# Patient Record
Sex: Male | Born: 1960 | Race: White | Hispanic: No | Smoking: Never smoker
Health system: Southern US, Community
[De-identification: ages and names within clinical notes are randomized; demographics above are authoritative.]

## PROBLEM LIST (undated history)

## (undated) DIAGNOSIS — K219 Gastro-esophageal reflux disease without esophagitis: Secondary | ICD-10-CM

## (undated) DIAGNOSIS — M1990 Unspecified osteoarthritis, unspecified site: Secondary | ICD-10-CM

## (undated) DIAGNOSIS — T783XXA Angioneurotic edema, initial encounter: Secondary | ICD-10-CM

## (undated) HISTORY — DX: Angioneurotic edema, initial encounter: T78.3XXA

## (undated) HISTORY — PX: TONSILLECTOMY: SUR1361

---

## 2009-10-30 HISTORY — PX: MENISCUS REPAIR: SHX5179

## 2020-10-30 DIAGNOSIS — Z87442 Personal history of urinary calculi: Secondary | ICD-10-CM

## 2020-10-30 HISTORY — DX: Personal history of urinary calculi: Z87.442

## 2020-11-03 ENCOUNTER — Other Ambulatory Visit: Payer: Self-pay

## 2021-08-02 NOTE — Progress Notes (Signed)
New Patient Note  RE: Joshua Sellers MRN: [id number] DOB: 08-20-1961 Date of Office Visit: 08/03/2021  Consult requested by: No ref. provider found Primary care provider: Malka So., MD  Chief Complaint: Allergies (Allergic reaction to bee stings and maybe environmentals)  History of Present Illness: I had the pleasure of seeing Joshua Sellers for initial evaluation at the [facility] on 08/03/2021. He is a 60 y.o. male, who is self-referred here for the evaluation of bee sting allergy.  Bee stings: Patient had multiple stings with no issues but about 5 years ago he broke out in whole body hives. He had steroid injection with good benefit.  Recently patient got stung inside the nose by a wasp while outdoors trimming bushes. He had large localized swelling on the nose and lips. He also broke out in hives. He took some benadryl before going to bed and the following morning he had facial swelling. He started on oral prednisone at that time.  No prior work up.  Patient has Epipen on hand but no prior use. Patient does spend a lot of time outdoors and interested in venom immunotherapy.   Rhinitis: He reports symptoms of headaches, nasal congestion, PND, coughing. Symptoms have been going on for 5 years. The symptoms are present all year around. Anosmia: no. Headache: yes. He has used Careers adviser, Claritin, Singulair with fair improvement in symptoms. Sinus infections: no. Previous work up includes: none. Previous ENT evaluation: no, tonsillectomy as a child. Previous sinus imaging: no. History of nasal polyps: no. History of reflux: some symptoms.   07/01/2021 PCP visit: "This patient reports he was stung by a bee yesterday. It flew up and nostril, stung him, and he immediately began experiencing intranasal and facial swelling. He has been taking Benadryl with partial relief. He has a history of bee sting reactions, and has an EpiPen on hand. He did not need to use it  yesterday. He is otherwise felt well. He denies chest pain, shortness of breath, stridor. Symptoms are slowly improving."  Assessment and Plan: Joshua Sellers is a 60 y.o. male with: Bee sting reaction Episodes of breaking out in hives after being stung requiring systemic steroids but never epinephrine. No prior work up. Continue to avoid. For mild symptoms you can take over the counter antihistamines such as Benadryl and monitor symptoms closely. If symptoms worsen or if you have severe symptoms including breathing issues, throat closure, significant swelling, whole body hives, severe diarrhea and vomiting, lightheadedness then inject epinephrine and seek immediate medical care afterwards. Action plan given. Get bloodwork - if positive will start venom immunotherapy. If negative, will recommend skin testing next due to strong clinical history.   Other allergic rhinitis Perennial rhinitis symptoms for 5 years. Tried antihistamines and Singulair with some benefit. No prior work up. Today's skin testing showed: Positive to one mold only. Discussed with patient that above allergen does not explain his perennial symptoms.  Start environmental control measures as below. Use over the counter antihistamines such as Zyrtec (cetirizine), Claritin (loratadine), Allegra (fexofenadine), or Xyzal (levocetirizine) daily as needed. May switch antihistamines every few months. Continue Singulair (montelukast) 10mg  daily at night. Start dymista (fluticasone + azelastine nasal spray combination) 1 spray per nostril twice a day. If it's not covered let know.  Nasal saline spray (i.e., Simply Saline) or nasal saline lavage (i.e., NeilMed) is recommended as needed and prior to medicated nasal sprays.  Cough Coughing at times but no prior diagnosis of asthma or  inhaler use. Some reflux symptoms. Today's spirometry was normal. Monitor symptoms. Concerning for PND or heartburn contributing to his  coughing.  Heartburn See handout for lifestyle and dietary modifications.  Return in about 3 months (around 11/03/2021).  Meds ordered this encounter  Medications   Azelastine-Fluticasone 137-50 MCG/ACT SUSP    Sig: Place 1 spray into the nose in the morning and at bedtime.    Dispense:  23 g    Refill:  5    Lab Orders         Allergen Hymenoptera Panel         Tryptase      Other allergy screening: Asthma: no Food allergy: no Medication allergy: no Urticaria: no Eczema:no History of recurrent infections suggestive of immunodeficency: no  Diagnostics: Spirometry:  Tracings reviewed. His effort: Good reproducible efforts. FVC: 4.58L FEV1: 3.55L, 92% predicted FEV1/FVC ratio: 78% Interpretation: Spirometry consistent with normal pattern.  Please see scanned spirometry results for details.  Skin Testing: Environmental allergy panel. Positive to one mold only. Results discussed with patient/family.  Airborne Adult Perc - 08/03/21 1438     Time Antigen Placed 1439    Allergen Manufacturer Waynette Buttery    Location Back    Number of Test 59    Panel 1 Select    1. Control-Buffer 50% Glycerol Negative    2. Control-Histamine 1 mg/ml 2+    3. Albumin saline Negative    4. Bahia Negative    5. French Southern Territories Negative    6. Johnson Negative    7. Kentucky Blue Negative    8. Meadow Fescue Negative    9. Perennial Rye Negative    10. Sweet Vernal Negative    11. Timothy Negative    12. Cocklebur Negative    13. Burweed Marshelder Negative    14. Ragweed, short Negative    15. Ragweed, Giant Negative    16. Plantain,  English Negative    17. Lamb's Quarters Negative    18. Sheep Sorrell Negative    19. Rough Pigweed Negative    20. Marsh Elder, Rough Negative    21. Mugwort, Common Negative    22. Ash mix Negative    23. Birch mix Negative    24. Beech American Negative    25. Box, Elder Negative    26. Cedar, red Negative    27. Cottonwood, Guinea-Bissau Negative    28. Elm  mix Negative    29. Hickory Negative    30. Maple mix Negative    31. Oak, Guinea-Bissau mix Negative    32. Pecan Pollen Negative    33. Pine mix Negative    34. Sycamore Eastern Negative    35. Walnut, Black Pollen Negative    36. Alternaria alternata Negative    37. Cladosporium Herbarum Negative    38. Aspergillus mix Negative    39. Penicillium mix Negative    40. Bipolaris sorokiniana (Helminthosporium) Negative    41. Drechslera spicifera (Curvularia) Negative    42. Mucor plumbeus Negative    43. Fusarium moniliforme Negative    44. Aureobasidium pullulans (pullulara) Negative    45. Rhizopus oryzae Negative    46. Botrytis cinera Negative    47. Epicoccum nigrum Negative    48. Phoma betae Negative    49. Candida Albicans Negative    50. Trichophyton mentagrophytes 2+    51. Mite, D Farinae  5,000 AU/ml Negative    52. Mite, D Pteronyssinus  5,000 AU/ml Negative    53.  Cat Hair 10,000 BAU/ml Negative    54.  Dog Epithelia Negative    55. Mixed Feathers Negative    56. Horse Epithelia Negative    57. Cockroach, German Negative    58. Mouse Negative    59. Tobacco Leaf Negative             Intradermal - 08/03/21 1538     Time Antigen Placed 1610    Allergen Manufacturer Waynette Buttery    Location Arm    Number of Test 15    Intradermal Select    Control Negative    French Southern Territories Negative    Johnson Negative    7 Grass Negative    Ragweed mix Negative    Weed mix Negative    Tree mix Negative    Mold 1 Negative    Mold 2 Negative    Mold 3 Negative    Mold 4 Negative    Cat Negative    Dog Negative    Cockroach Negative    Mite mix Negative             Past Medical History: Patient Active Problem List   Diagnosis Date Noted   Bee sting reaction 08/03/2021   Cough 08/03/2021   Other allergic rhinitis 08/03/2021   Heartburn 08/03/2021    Past Medical History:  Diagnosis Date   Angio-edema    Past Surgical History: Past Surgical History:  Procedure  Laterality Date   TONSILLECTOMY     Medication List:  Current Outpatient Medications  Medication Sig Dispense Refill   Azelastine-Fluticasone 137-50 MCG/ACT SUSP Place 1 spray into the nose in the morning and at bedtime. 23 g 5   chlorhexidine (PERIDEX) 0.12 % solution 15 mLs 2 (two) times daily.     diphenhydrAMINE (SOMINEX) 25 MG tablet Take by mouth.     EPINEPHrine 0.3 mg/0.3 mL IJ SOAJ injection Inject into the muscle.     fexofenadine (ALLEGRA) 180 MG tablet Take 180 mg by mouth daily.     ketoconazole (NIZORAL) 2 % shampoo Apply topically.     montelukast (SINGULAIR) 10 MG tablet Take by mouth.     Tadalafil 2.5 MG TABS Cialis 2.5 mg tablet     No current facility-administered medications for this visit.   Allergies: Allergies  Allergen Reactions   Bee Venom Hives, Itching, Other (See Comments), Rash and Swelling    Swelling  Swelling     Poison Oak Extract Hives and Rash   Pollen Extract Other (See Comments)   Social History: Social History   Socioeconomic History   Marital status: Not on file    Spouse name: Not on file   Number of children: Not on file   Years of education: Not on file   Highest education level: Not on file  Occupational History   Not on file  Tobacco Use   Smoking status: Never    Passive exposure: Never   Smokeless tobacco: Never  Vaping Use   Vaping Use: Never used  Substance and Sexual Activity   Alcohol use: Yes    Alcohol/week: 3.0 standard drinks    Types: 3 Standard drinks or equivalent per week   Drug use: Never   Sexual activity: Not on file  Other Topics Concern   Not on file  Social History Narrative   Not on file   Social Determinants of Health   Financial Resource Strain: Not on file  Food Insecurity: Not on file  Transportation Needs: Not on file  Physical Activity: Not on file  Stress: Not on file  Social Connections: Not on file   Lives in a 61 year old house. Smoking: denies Occupation: Art gallery manager HistorySurveyor, minerals in the house: no Engineer, civil (consulting) in the family room: no Carpet in the bedroom: no Heating: gas Cooling: central Pet: yes 4 cat x 4 yrs  Family History: Family History  Problem Relation Age of Onset   Allergic rhinitis Father    Asthma Neg Hx    Eczema Neg Hx    Urticaria Neg Hx    Review of Systems  Constitutional:  Negative for appetite change, chills, fever and unexpected weight change.  HENT:  Positive for congestion and postnasal drip. Negative for rhinorrhea.   Eyes:  Negative for itching.  Respiratory:  Positive for cough. Negative for chest tightness, shortness of breath and wheezing.   Cardiovascular:  Negative for chest pain.  Gastrointestinal:  Negative for abdominal pain.  Genitourinary:  Negative for difficulty urinating.  Skin:  Negative for rash.  Allergic/Immunologic: Positive for environmental allergies.  Neurological:  Positive for headaches.   Objective: BP (!) 124/52 (BP Location: Right Arm, Patient Position: Sitting, Cuff Size: Normal)   Pulse 63   Temp 98.2 F (36.8 C) (Temporal)   Resp 18   Ht 6\' 1"  (1.854 m)   Wt 162 lb 6.4 oz (73.7 kg)   SpO2 97%   BMI 21.43 kg/m  Body mass index is 21.43 kg/m. Physical Exam Vitals and nursing note reviewed.  Constitutional:      Appearance: Normal appearance. He is well-developed.  HENT:     Head: Normocephalic and atraumatic.     Right Ear: Tympanic membrane and external ear normal.     Left Ear: Tympanic membrane and external ear normal.     Nose: Nose normal.     Mouth/Throat:     Mouth: Mucous membranes are moist.     Pharynx: Oropharynx is clear.  Eyes:     Conjunctiva/sclera: Conjunctivae normal.  Cardiovascular:     Rate and Rhythm: Normal rate and regular rhythm.     Heart sounds: Normal heart sounds. No murmur heard.   No friction rub. No gallop.  Pulmonary:     Effort: Pulmonary effort is normal.     Breath sounds: Normal breath sounds. No  wheezing, rhonchi or rales.  Musculoskeletal:     Cervical back: Neck supple.  Skin:    General: Skin is warm.     Findings: No rash.  Neurological:     Mental Status: He is alert and oriented to person, place, and time.  Psychiatric:        Behavior: Behavior normal.  The plan was reviewed with the patient/family, and all questions/concerned were addressed.  It was my pleasure to see Zhamir today and participate in his care. Please feel free to contact me with any questions or concerns.  Sincerely,  Onalee Hua, DO Allergy & Immunology  [facility] office: [phone] [facility] office: [phone]

## 2021-08-03 ENCOUNTER — Encounter: Payer: Self-pay | Admitting: Allergy

## 2021-08-03 ENCOUNTER — Other Ambulatory Visit: Payer: Self-pay

## 2021-08-03 ENCOUNTER — Ambulatory Visit (INDEPENDENT_AMBULATORY_CARE_PROVIDER_SITE_OTHER): Payer: BC Managed Care – PPO | Admitting: Allergy

## 2021-08-03 VITALS — BP 124/52 | HR 63 | Temp 98.2°F | Resp 18 | Ht 73.0 in | Wt 162.4 lb

## 2021-08-03 DIAGNOSIS — T63441D Toxic effect of venom of bees, accidental (unintentional), subsequent encounter: Secondary | ICD-10-CM

## 2021-08-03 DIAGNOSIS — T63441A Toxic effect of venom of bees, accidental (unintentional), initial encounter: Secondary | ICD-10-CM | POA: Insufficient documentation

## 2021-08-03 DIAGNOSIS — R12 Heartburn: Secondary | ICD-10-CM | POA: Insufficient documentation

## 2021-08-03 DIAGNOSIS — R058 Other specified cough: Secondary | ICD-10-CM

## 2021-08-03 DIAGNOSIS — R059 Cough, unspecified: Secondary | ICD-10-CM | POA: Insufficient documentation

## 2021-08-03 DIAGNOSIS — J3089 Other allergic rhinitis: Secondary | ICD-10-CM | POA: Diagnosis not present

## 2021-08-03 MED ORDER — AZELASTINE-FLUTICASONE 137-50 MCG/ACT NA SUSP: 1.0000 | Freq: Two times a day (BID) | NASAL | 5 refills | Status: DC

## 2021-08-03 NOTE — Assessment & Plan Note (Signed)
Coughing at times but no prior diagnosis of asthma or inhaler use. Some reflux symptoms.  Today's spirometry was normal. . Monitor symptoms. . Concerning for PND or heartburn contributing to his coughing.

## 2021-08-03 NOTE — Assessment & Plan Note (Signed)
See handout for lifestyle and dietary modifications. 

## 2021-08-03 NOTE — Assessment & Plan Note (Signed)
Perennial rhinitis symptoms for 5 years. Tried antihistamines and Singulair with some benefit. No prior work up.  Today's skin testing showed: Positive to one mold only.  Discussed with patient that above allergen does not explain his perennial symptoms.   Start environmental control measures as below.  Use over the counter antihistamines such as Zyrtec (cetirizine), Claritin (loratadine), Allegra (fexofenadine), or Xyzal (levocetirizine) daily as needed. May switch antihistamines every few months.  Continue Singulair (montelukast) 10mg  daily at night.  Start dymista (fluticasone + azelastine nasal spray combination) 1 spray per nostril twice a day. If it's not covered let know.   Nasal saline spray (i.e., Simply Saline) or nasal saline lavage (i.e., NeilMed) is recommended as needed and prior to medicated nasal sprays.

## 2021-08-03 NOTE — Assessment & Plan Note (Signed)
Episodes of breaking out in hives after being stung requiring systemic steroids but never epinephrine. No prior work up. . Continue to avoid. . For mild symptoms you can take over the counter antihistamines such as Benadryl and monitor symptoms closely. If symptoms worsen or if you have severe symptoms including breathing issues, throat closure, significant swelling, whole body hives, severe diarrhea and vomiting, lightheadedness then inject epinephrine and seek immediate medical care afterwards. . Action plan given. . Get bloodwork - if positive will start venom immunotherapy. o If negative, will recommend skin testing next due to strong clinical history.

## 2021-08-03 NOTE — Patient Instructions (Addendum)
Today's skin testing showed: Positive to one mold only. Results given.  Bee sting:  Continue to avoid. For mild symptoms you can take over the counter antihistamines such as Benadryl and monitor symptoms closely. If symptoms worsen or if you have severe symptoms including breathing issues, throat closure, significant swelling, whole body hives, severe diarrhea and vomiting, lightheadedness then inject epinephrine and seek immediate medical care afterwards. Action plan given. Get bloodwork - if positive will start allergy injections. If negative, will recommend skin testing due to strong clinical history.  We are ordering labs, so please allow 1-2 weeks for the results to come back. With the newly implemented Cures Act, the labs might be visible to you at the same time that they become visible to me. However, I will not address the results until all of the results are back, so please be patient.  In the meantime, continue recommendations in your patient instructions, including avoidance measures (if applicable), until you hear from me.  Environmental allergies Start environmental control measures as below. Use over the counter antihistamines such as Zyrtec (cetirizine), Claritin (loratadine), Allegra (fexofenadine), or Xyzal (levocetirizine) daily as needed. May switch antihistamines every few months. Continue Singulair (montelukast) 10mg  daily at night.  Start dymista (fluticasone + azelastine nasal spray combination) 1 spray per nostril twice a day. If it's not covered let know.  Nasal saline spray (i.e., Simply Saline) or nasal saline lavage (i.e., NeilMed) is recommended as needed and prior to medicated nasal sprays.  Coughing: Normal breathing test today. Monitor symptoms.  Heartburn: See handout for lifestyle and dietary modifications.  Follow up in 3 months or sooner if needed.    Mold Control Mold and fungi can grow on a variety of surfaces provided certain temperature and  moisture conditions exist.  Outdoor molds grow on plants, decaying vegetation and soil. The major outdoor mold, Alternaria and Cladosporium, are found in very high numbers during hot and dry conditions. Generally, a late summer - fall peak is seen for common outdoor fungal spores. Rain will temporarily lower outdoor mold spore count, but counts rise rapidly when the rainy period ends. The most important indoor molds are Aspergillus and Penicillium. Dark, humid and poorly ventilated basements are ideal sites for mold growth. The next most common sites of mold growth are the bathroom and the kitchen. Outdoor (Seasonal) Mold Control Use air conditioning and keep windows closed. Avoid exposure to decaying vegetation. Avoid leaf raking. Avoid grain handling. Consider wearing a face mask if working in moldy areas.  Indoor (Perennial) Mold Control  Maintain humidity below 50%. Get rid of mold growth on hard surfaces with water, detergent and, if necessary, 5% bleach (do not mix with other cleaners). Then dry the area completely. If mold covers an area more than 10 square feet, consider hiring an indoor environmental professional. For clothing, washing with soap and water is best. If moldy items cannot be cleaned and dried, throw them away. Remove sources e.g. contaminated carpets. Repair and seal leaking roofs or pipes. Using dehumidifiers in damp basements may be helpful, but empty the water and clean units regularly to prevent mildew from forming. All rooms, especially basements, bathrooms and kitchens, require ventilation and cleaning to deter mold and mildew growth. Avoid carpeting on concrete or damp floors, and storing items in damp areas.

## 2021-08-24 ENCOUNTER — Telehealth: Payer: Self-pay | Admitting: Allergy

## 2021-08-24 NOTE — Telephone Encounter (Signed)
Informed pt of going to [facility] and no appt needed he stated understanding

## 2021-08-24 NOTE — Telephone Encounter (Signed)
Please call patient.  He was given lab order for tryptase and hymenoptera panel.   Get bloodwork - if positive will start venom immunotherapy. If negative, will recommend skin testing next due to strong clinical history.

## 2021-08-24 NOTE — Telephone Encounter (Signed)
Please advise for directions for blood work.

## 2021-08-24 NOTE — Telephone Encounter (Signed)
Patient called to talk with Dr. Selena Batten about having the blood work for venoms done. She told him to go to [facility] and they want him to see one of there doctor. So he is confused and may not have the correct information, can someone call him [phone]

## 2021-09-07 LAB — ALLERGEN HYMENOPTERA PANEL
Bumblebee: <0.1 kU/L
Honeybee IgE: <0.1 kU/L
Hornet, White Face, IgE: 0.89 kU/L — AB
Hornet, Yellow, IgE: 0.5 kU/L — AB
Paper Wasp IgE: 4.19 kU/L — AB
Yellow Jacket, IgE: 2.09 kU/L — AB

## 2021-09-07 LAB — TRYPTASE: Tryptase: 3.6 ug/L (ref 2.2–13.2)

## 2021-09-08 ENCOUNTER — Telehealth: Payer: Self-pay

## 2021-09-08 DIAGNOSIS — T782XXD Anaphylactic shock, unspecified, subsequent encounter: Secondary | ICD-10-CM

## 2021-09-08 NOTE — Addendum Note (Signed)
Addended by: Ellamae Sia on: 09/08/2021 12:53 PM   Modules accepted: Orders

## 2021-09-08 NOTE — Telephone Encounter (Signed)
Patient call back returning our phone call about his lab results. Patient was informed of his lab results and verbalized understanding and scheduled an appointment to start venom injections.

## 2021-09-08 NOTE — Telephone Encounter (Signed)
Noted. Orders placed.

## 2021-10-11 ENCOUNTER — Ambulatory Visit (INDEPENDENT_AMBULATORY_CARE_PROVIDER_SITE_OTHER): Payer: BC Managed Care – PPO

## 2021-10-11 ENCOUNTER — Other Ambulatory Visit: Payer: Self-pay

## 2021-10-11 DIAGNOSIS — T63441D Toxic effect of venom of bees, accidental (unintentional), subsequent encounter: Secondary | ICD-10-CM

## 2021-10-11 NOTE — Progress Notes (Addendum)
Immunotherapy   Patient Details  Name: Joshua Sellers MRN: [id number] Date of Birth: 1960/12/31  10/11/2021  [name] started Venom (MV-0.003 and Wasp-0.001 mg/mL Frequency: 1 time per week, @ 0.05 given Epi-Pen:Epi-Pen Available  Consent signed and patient instructions given.   [name] J [name] 10/11/2021, 8:58 AM

## 2021-10-19 ENCOUNTER — Ambulatory Visit (INDEPENDENT_AMBULATORY_CARE_PROVIDER_SITE_OTHER): Payer: BC Managed Care – PPO | Admitting: *Deleted

## 2021-10-19 DIAGNOSIS — T63441D Toxic effect of venom of bees, accidental (unintentional), subsequent encounter: Secondary | ICD-10-CM | POA: Diagnosis not present

## 2021-10-26 ENCOUNTER — Other Ambulatory Visit: Payer: Self-pay

## 2021-10-26 ENCOUNTER — Ambulatory Visit (INDEPENDENT_AMBULATORY_CARE_PROVIDER_SITE_OTHER): Payer: BC Managed Care – PPO

## 2021-10-26 DIAGNOSIS — T63441D Toxic effect of venom of bees, accidental (unintentional), subsequent encounter: Secondary | ICD-10-CM | POA: Diagnosis not present

## 2021-11-02 ENCOUNTER — Other Ambulatory Visit: Payer: Self-pay

## 2021-11-02 ENCOUNTER — Ambulatory Visit (INDEPENDENT_AMBULATORY_CARE_PROVIDER_SITE_OTHER): Payer: BC Managed Care – PPO

## 2021-11-02 DIAGNOSIS — T63441D Toxic effect of venom of bees, accidental (unintentional), subsequent encounter: Secondary | ICD-10-CM | POA: Diagnosis not present

## 2021-11-09 ENCOUNTER — Other Ambulatory Visit: Payer: Self-pay

## 2021-11-09 ENCOUNTER — Ambulatory Visit (INDEPENDENT_AMBULATORY_CARE_PROVIDER_SITE_OTHER): Payer: BC Managed Care – PPO

## 2021-11-09 DIAGNOSIS — T63441D Toxic effect of venom of bees, accidental (unintentional), subsequent encounter: Secondary | ICD-10-CM

## 2021-11-16 ENCOUNTER — Ambulatory Visit (INDEPENDENT_AMBULATORY_CARE_PROVIDER_SITE_OTHER): Payer: BC Managed Care – PPO

## 2021-11-16 ENCOUNTER — Other Ambulatory Visit: Payer: Self-pay

## 2021-11-16 DIAGNOSIS — T63441D Toxic effect of venom of bees, accidental (unintentional), subsequent encounter: Secondary | ICD-10-CM | POA: Diagnosis not present

## 2021-11-23 ENCOUNTER — Ambulatory Visit (INDEPENDENT_AMBULATORY_CARE_PROVIDER_SITE_OTHER): Payer: BC Managed Care – PPO

## 2021-11-23 ENCOUNTER — Other Ambulatory Visit: Payer: Self-pay

## 2021-11-23 DIAGNOSIS — T63441D Toxic effect of venom of bees, accidental (unintentional), subsequent encounter: Secondary | ICD-10-CM | POA: Diagnosis not present

## 2021-11-30 ENCOUNTER — Other Ambulatory Visit: Payer: Self-pay

## 2021-11-30 ENCOUNTER — Ambulatory Visit (INDEPENDENT_AMBULATORY_CARE_PROVIDER_SITE_OTHER): Payer: BC Managed Care – PPO

## 2021-11-30 DIAGNOSIS — T63441D Toxic effect of venom of bees, accidental (unintentional), subsequent encounter: Secondary | ICD-10-CM | POA: Diagnosis not present

## 2021-12-07 ENCOUNTER — Ambulatory Visit (INDEPENDENT_AMBULATORY_CARE_PROVIDER_SITE_OTHER): Payer: BC Managed Care – PPO

## 2021-12-07 ENCOUNTER — Other Ambulatory Visit: Payer: Self-pay

## 2021-12-07 DIAGNOSIS — T63441D Toxic effect of venom of bees, accidental (unintentional), subsequent encounter: Secondary | ICD-10-CM

## 2021-12-14 ENCOUNTER — Telehealth: Payer: Self-pay

## 2021-12-14 ENCOUNTER — Ambulatory Visit (INDEPENDENT_AMBULATORY_CARE_PROVIDER_SITE_OTHER): Payer: BC Managed Care – PPO

## 2021-12-14 ENCOUNTER — Other Ambulatory Visit: Payer: Self-pay

## 2021-12-14 DIAGNOSIS — T63441D Toxic effect of venom of bees, accidental (unintentional), subsequent encounter: Secondary | ICD-10-CM

## 2021-12-14 NOTE — Telephone Encounter (Signed)
Patient came in today and stated that he has been experiencing facial tingling 2 weeks after starting venom injections. He is having some nerve issues in neck and spine but his neurologist doesn't think the head would be involved. Spoke with Dr. Delorse Lek to see if he should get his venom today and she said yes. She also doesn't think the tingling in his face has anything to do with the shots but wanted to sent message to you. Please advise.

## 2021-12-14 NOTE — Telephone Encounter (Signed)
Unlikely to be related to VIT. But have patient schedule a follow up to discuss further.  Thank you.

## 2021-12-21 ENCOUNTER — Ambulatory Visit (INDEPENDENT_AMBULATORY_CARE_PROVIDER_SITE_OTHER): Payer: BC Managed Care – PPO

## 2021-12-21 ENCOUNTER — Other Ambulatory Visit: Payer: Self-pay

## 2021-12-21 DIAGNOSIS — T63441D Toxic effect of venom of bees, accidental (unintentional), subsequent encounter: Secondary | ICD-10-CM | POA: Diagnosis not present

## 2021-12-21 NOTE — Telephone Encounter (Signed)
Patient came in today for venom injections. Dr. Elmyra Ricks message given to patient. Patient made appointment for follow up visit.

## 2021-12-28 ENCOUNTER — Ambulatory Visit (INDEPENDENT_AMBULATORY_CARE_PROVIDER_SITE_OTHER): Payer: BC Managed Care – PPO

## 2021-12-28 ENCOUNTER — Other Ambulatory Visit: Payer: Self-pay | Admitting: Neurosurgery

## 2021-12-28 ENCOUNTER — Other Ambulatory Visit: Payer: Self-pay

## 2021-12-28 DIAGNOSIS — T63441D Toxic effect of venom of bees, accidental (unintentional), subsequent encounter: Secondary | ICD-10-CM

## 2022-01-03 ENCOUNTER — Ambulatory Visit (INDEPENDENT_AMBULATORY_CARE_PROVIDER_SITE_OTHER): Payer: BC Managed Care – PPO

## 2022-01-03 ENCOUNTER — Other Ambulatory Visit: Payer: Self-pay

## 2022-01-03 DIAGNOSIS — T63441D Toxic effect of venom of bees, accidental (unintentional), subsequent encounter: Secondary | ICD-10-CM | POA: Diagnosis not present

## 2022-01-11 ENCOUNTER — Ambulatory Visit (INDEPENDENT_AMBULATORY_CARE_PROVIDER_SITE_OTHER): Payer: BC Managed Care – PPO

## 2022-01-11 ENCOUNTER — Other Ambulatory Visit: Payer: Self-pay

## 2022-01-11 DIAGNOSIS — T63441D Toxic effect of venom of bees, accidental (unintentional), subsequent encounter: Secondary | ICD-10-CM | POA: Diagnosis not present

## 2022-01-17 DIAGNOSIS — T782XXA Anaphylactic shock, unspecified, initial encounter: Secondary | ICD-10-CM | POA: Insufficient documentation

## 2022-01-17 NOTE — Progress Notes (Signed)
? ?Follow Up Note ? ?RE: Joshua Sellers MRN: [id number] DOB: 1961-06-13 ?Date of Office Visit: 01/18/2022 ? ?Referring provider: Malka So., MD ?Primary care provider: Malka So., MD ? ?Chief Complaint: Other (Facial, arm, leg tingling) ? ?History of Present Illness: ?I had the pleasure of seeing Joshua Sellers for a follow up visit at the [facility] on 01/18/2022. He is a 61 y.o. 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 His previous allergy office visit was on 08/03/2021 with Dr. Selena Batten. Today is a regular follow up visit. ? ?Bee sting allergy ?Started VIT on 10/11/2021. ?He noted some numbness on left arm on 10/04/21 this started before starting the injections. ?While waiting after his first VIT he noted some left arm and left leg numbness as well.  ? ?He then noted left sided facial numbness the following week. ? ?Went to see cardiologist and work up was negative. ?Then he went to see neurologist and had MRI of brain/neck. 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 Scheduled for surgery in June. Per neurology though this does not explain his one sided facial numbness.  ? ?Symptoms do not worsen on the days of injections. ? ?Getting VIT weekly with no issues. ? ?No stings since the last visit. ?Still has Epipen. ?  ?Allergic rhinitis ?Taking Singulair and allegra daily with some benefit. ?Still has some nasal congestion at times. Uses nasal spray on a rare basis.  ? ?Cough ?Resolved. ?  ?Heartburn ?Sometimes. Noted that margaritas worsen symptoms.  ? ?Assessment and Plan: ?Joshua Sellers is a 61 y.o. male with: ?Anaphylaxis due to hymenoptera venom ?Past history - Episodes of breaking out in hives after being stung requiring systemic steroids but never epinephrine.  ?Interim history - 2022 bloodwork positive to white face hornet, yellow jacket, wasp and yellow hornet. Started VIT on 10/11/2021 (MV & Wasp). ?Patient concerned if the VIT or allergies  could be contributing to his left sided facial numbness. Discussed that unlikely that his symptoms are related to the VIT as they do not worsen the day of injections and one sided symptoms are not typical of allergies. ?Already being followed by neurology.  ?Continue to avoid. ?Continue injections - given today.  ?For mild symptoms you can take over the counter antihistamines such as Benadryl and monitor symptoms closely. If symptoms worsen or if you have severe symptoms including breathing issues, throat closure, significant swelling, whole body hives, severe diarrhea and vomiting, lightheadedness then inject epinephrine and seek immediate medical care afterwards. ?Action plan in place.  ? ?Other allergic rhinitis ?Past history - Perennial rhinitis symptoms for 5 years. 2022 skin testing showed: Positive to one mold only. ?Interim history - controlled with Singulair and allegra. ?Continue environmental control measures as below. ?Use over the counter antihistamines such as Zyrtec (cetirizine), Claritin (loratadine), Allegra (fexofenadine), or Xyzal (levocetirizine) daily as needed. May switch antihistamines every few months. ?Continue Singulair (montelukast) 10mg  daily at night. ?May use dymista (fluticasone + azelastine nasal spray combination) 1 spray per nostril twice a day if needed. ?Nasal saline spray (i.e., Simply Saline) or nasal saline lavage (i.e., NeilMed) is recommended as needed and prior to medicated nasal sprays. ? ?Heartburn ?Continue lifestyle and dietary modifications. ? ?Return in about 6 months (around 07/21/2022). ? ?No orders of the defined types were placed in this encounter. ? ?Lab Orders  ?No laboratory test(s) ordered today  ? ? ?Diagnostics: ?None.  ? ? ?Medication List:  ?  Current Outpatient Medications  ?Medication Sig Dispense Refill  ? Azelastine-Fluticasone 137-50 MCG/ACT SUSP Place 1 spray into the nose in the morning and at bedtime. 23 g 5  ? chlorhexidine (PERIDEX) 0.12 % solution  15 mLs 2 (two) times daily.    ? EPINEPHrine 0.3 mg/0.3 mL IJ SOAJ injection Inject into the muscle.    ? fexofenadine (ALLEGRA) 180 MG tablet Take 180 mg by mouth daily.    ? ketoconazole (NIZORAL) 2 % shampoo Apply topically.    ? montelukast (SINGULAIR) 10 MG tablet Take by mouth.    ? Tadalafil 2.5 MG TABS Cialis 2.5 mg tablet    ? ?No current facility-administered medications for this visit.  ? ?Allergies: ?Allergies  ?Allergen Reactions  ? Bee Venom Hives, Itching, Other (See Comments), Rash and Swelling  ?  Swelling  ?Swelling  ?  ? Poison Oak Extract Hives and Rash  ? Pollen Extract Other (See Comments)  ? ?I reviewed his past medical history, social history, family history, and environmental history and no significant changes have been reported from his previous visit. ? ?Review of Systems  ?Constitutional:  Negative for appetite change, chills, fever and unexpected weight change.  ?HENT:  Positive for congestion. Negative for postnasal drip and rhinorrhea.   ?Eyes:  Negative for itching.  ?Respiratory:  Negative for cough, chest tightness, shortness of breath and wheezing.   ?Cardiovascular:  Negative for chest pain.  ?Gastrointestinal:  Negative for abdominal pain.  ?Genitourinary:  Negative for difficulty urinating.  ?Skin:  Negative for rash.  ?Allergic/Immunologic: Positive for environmental allergies.  ? ?Objective: ?BP 132/78   Pulse (!) 54   Temp 98 ?F (36.7 ?C) (Temporal)   Resp 16   SpO2 98%  ?There is no height or weight on file to calculate BMI. ?Physical Exam ?Vitals and nursing note reviewed.  ?Constitutional:   ?   Appearance: Normal appearance. He is well-developed.  ?HENT:  ?   Head: Normocephalic and atraumatic.  ?   Right Ear: Tympanic membrane and external ear normal.  ?   Left Ear: Tympanic membrane and external ear normal.  ?   Nose: Nose normal.  ?   Mouth/Throat:  ?   Mouth: Mucous membranes are moist.  ?   Pharynx: Oropharynx is clear.  ?Eyes:  ?   Conjunctiva/sclera:  Conjunctivae normal.  ?Cardiovascular:  ?   Rate and Rhythm: Normal rate and regular rhythm.  ?   Heart sounds: Normal heart sounds. No murmur heard. ?  No friction rub. No gallop.  ?Pulmonary:  ?   Effort: Pulmonary effort is normal.  ?   Breath sounds: Normal breath sounds. No wheezing, rhonchi or rales.  ?Musculoskeletal:  ?   Cervical back: Neck supple.  ?Skin: ?   General: Skin is warm.  ?   Findings: No rash.  ?Neurological:  ?   Mental Status: He is alert and oriented to person, place, and time.  ?Psychiatric:     ?   Behavior: Behavior normal.  ? ?Previous notes and tests were reviewed. ?The plan was reviewed with the patient/family, and all questions/concerned were addressed. ? ?It was my pleasure to see Joshua today and participate in his care. Please feel free to contact me with any questions or concerns. ? ?Sincerely, ? ?Wyline Mood, DO ?Allergy & Immunology ? ?[facility] ?[place] office: [phone] ?[place] office: [phone] ?

## 2022-01-18 ENCOUNTER — Ambulatory Visit: Payer: BC Managed Care – PPO

## 2022-01-18 ENCOUNTER — Other Ambulatory Visit: Payer: Self-pay

## 2022-01-18 ENCOUNTER — Ambulatory Visit: Payer: BC Managed Care – PPO | Admitting: Allergy

## 2022-01-18 ENCOUNTER — Ambulatory Visit (INDEPENDENT_AMBULATORY_CARE_PROVIDER_SITE_OTHER): Payer: BC Managed Care – PPO

## 2022-01-18 ENCOUNTER — Encounter: Payer: Self-pay | Admitting: Allergy

## 2022-01-18 VITALS — BP 132/78 | HR 54 | Temp 98.0°F | Resp 16

## 2022-01-18 DIAGNOSIS — J3089 Other allergic rhinitis: Secondary | ICD-10-CM

## 2022-01-18 DIAGNOSIS — T782XXD Anaphylactic shock, unspecified, subsequent encounter: Secondary | ICD-10-CM

## 2022-01-18 DIAGNOSIS — T63441D Toxic effect of venom of bees, accidental (unintentional), subsequent encounter: Secondary | ICD-10-CM | POA: Diagnosis not present

## 2022-01-18 DIAGNOSIS — R12 Heartburn: Secondary | ICD-10-CM

## 2022-01-18 NOTE — Assessment & Plan Note (Signed)
Continue lifestyle and dietary modifications. 

## 2022-01-18 NOTE — Assessment & Plan Note (Addendum)
Past history - Episodes of breaking out in hives after being stung requiring systemic steroids but never epinephrine.  ?Interim history - 2022 bloodwork positive to white face hornet, yellow jacket, wasp and yellow hornet. Started VIT on 10/11/2021 (MV & Wasp). ?? Patient concerned if the VIT or allergies could be contributing to his left sided facial numbness. Discussed that unlikely that his symptoms are related to the VIT as they do not worsen the day of injections and one sided symptoms are not typical of allergies. ?? Already being followed by neurology.  ?? Continue to avoid. ?? Continue injections - given today.  ?? For mild symptoms you can take over the counter antihistamines such as Benadryl and monitor symptoms closely. If symptoms worsen or if you have severe symptoms including breathing issues, throat closure, significant swelling, whole body hives, severe diarrhea and vomiting, lightheadedness then inject epinephrine and seek immediate medical care afterwards. ?? Action plan in place.  ?

## 2022-01-18 NOTE — Assessment & Plan Note (Signed)
Past history - Perennial rhinitis symptoms for 5 years. 2022 skin testing showed: Positive to one mold only. ?Interim history - controlled with Singulair and allegra. ?? Continue environmental control measures as below. ?? Use over the counter antihistamines such as Zyrtec (cetirizine), Claritin (loratadine), Allegra (fexofenadine), or Xyzal (levocetirizine) daily as needed. May switch antihistamines every few months. ?? Continue Singulair (montelukast) 10mg  daily at night. ?? May use dymista (fluticasone + azelastine nasal spray combination) 1 spray per nostril twice a day if needed. ?? Nasal saline spray (i.e., Simply Saline) or nasal saline lavage (i.e., NeilMed) is recommended as needed and prior to medicated nasal sprays. ?

## 2022-01-18 NOTE — Patient Instructions (Addendum)
Allergies and immunotherapy does not cause one sided numbness as you described.  ? ?Bee sting:  ?Continue to avoid. ?Continue injections - given today.  ?For mild symptoms you can take over the counter antihistamines such as Benadryl and monitor symptoms closely. If symptoms worsen or if you have severe symptoms including breathing issues, throat closure, significant swelling, whole body hives, severe diarrhea and vomiting, lightheadedness then inject epinephrine and seek immediate medical care afterwards. ?Action plan in place.  ? ?Environmental allergies ?2022 skin testing positive to mold only.  ?Continue environmental control measures as below. ?Use over the counter antihistamines such as Zyrtec (cetirizine), Claritin (loratadine), Allegra (fexofenadine), or Xyzal (levocetirizine) daily as needed. May switch antihistamines every few months. ?Continue Singulair (montelukast) 10mg  daily at night. ?May use dymista (fluticasone + azelastine nasal spray combination) 1 spray per nostril twice a day if needed. ?Nasal saline spray (i.e., Simply Saline) or nasal saline lavage (i.e., NeilMed) is recommended as needed and prior to medicated nasal sprays. ? ?Heartburn: ?Continue lifestyle and dietary modifications. ? ?Follow up in 6 months or sooner if needed.   ? ?Mold Control ?Mold and fungi can grow on a variety of surfaces provided certain temperature and moisture conditions exist.  ?Outdoor molds grow on plants, decaying vegetation and soil. The major outdoor mold, Alternaria and Cladosporium, are found in very high numbers during hot and dry conditions. Generally, a late summer - fall peak is seen for common outdoor fungal spores. Rain will temporarily lower outdoor mold spore count, but counts rise rapidly when the rainy period ends. ?The most important indoor molds are Aspergillus and Penicillium. Dark, humid and poorly ventilated basements are ideal sites for mold growth. The next most common sites of mold growth  are the bathroom and the kitchen. ?Outdoor (Seasonal) Mold Control ?Use air conditioning and keep windows closed. ?Avoid exposure to decaying vegetation. ?Avoid leaf raking. ?Avoid grain handling. ?Consider wearing a face mask if working in moldy areas.  ?Indoor (Perennial) Mold Control  ?Maintain humidity below 50%. ?Get rid of mold growth on hard surfaces with water, detergent and, if necessary, 5% bleach (do not mix with other cleaners). Then dry the area completely. If mold covers an area more than 10 square feet, consider hiring an indoor environmental professional. ?For clothing, washing with soap and water is best. If moldy items cannot be cleaned and dried, throw them away. ?Remove sources e.g. contaminated carpets. ?Repair and seal leaking roofs or pipes. Using dehumidifiers in damp basements may be helpful, but empty the water and clean units regularly to prevent mildew from forming. All rooms, especially basements, bathrooms and kitchens, require ventilation and cleaning to deter mold and mildew growth. Avoid carpeting on concrete or damp floors, and storing items in damp areas. ?

## 2022-01-25 ENCOUNTER — Ambulatory Visit (INDEPENDENT_AMBULATORY_CARE_PROVIDER_SITE_OTHER): Payer: BC Managed Care – PPO

## 2022-01-25 ENCOUNTER — Other Ambulatory Visit: Payer: Self-pay

## 2022-01-25 DIAGNOSIS — T63441D Toxic effect of venom of bees, accidental (unintentional), subsequent encounter: Secondary | ICD-10-CM

## 2022-02-01 ENCOUNTER — Ambulatory Visit (INDEPENDENT_AMBULATORY_CARE_PROVIDER_SITE_OTHER): Payer: BC Managed Care – PPO

## 2022-02-01 DIAGNOSIS — T63441D Toxic effect of venom of bees, accidental (unintentional), subsequent encounter: Secondary | ICD-10-CM

## 2022-02-08 ENCOUNTER — Ambulatory Visit (INDEPENDENT_AMBULATORY_CARE_PROVIDER_SITE_OTHER): Payer: BC Managed Care – PPO

## 2022-02-08 DIAGNOSIS — T63441D Toxic effect of venom of bees, accidental (unintentional), subsequent encounter: Secondary | ICD-10-CM | POA: Diagnosis not present

## 2022-02-15 ENCOUNTER — Ambulatory Visit (INDEPENDENT_AMBULATORY_CARE_PROVIDER_SITE_OTHER): Payer: BC Managed Care – PPO

## 2022-02-15 DIAGNOSIS — T63441D Toxic effect of venom of bees, accidental (unintentional), subsequent encounter: Secondary | ICD-10-CM | POA: Diagnosis not present

## 2022-02-21 ENCOUNTER — Ambulatory Visit (INDEPENDENT_AMBULATORY_CARE_PROVIDER_SITE_OTHER): Payer: BC Managed Care – PPO

## 2022-02-21 DIAGNOSIS — T63441D Toxic effect of venom of bees, accidental (unintentional), subsequent encounter: Secondary | ICD-10-CM

## 2022-03-01 ENCOUNTER — Ambulatory Visit: Payer: BC Managed Care – PPO

## 2022-03-01 ENCOUNTER — Ambulatory Visit (INDEPENDENT_AMBULATORY_CARE_PROVIDER_SITE_OTHER): Payer: BC Managed Care – PPO

## 2022-03-01 DIAGNOSIS — T63441D Toxic effect of venom of bees, accidental (unintentional), subsequent encounter: Secondary | ICD-10-CM | POA: Diagnosis not present

## 2022-03-08 ENCOUNTER — Ambulatory Visit (INDEPENDENT_AMBULATORY_CARE_PROVIDER_SITE_OTHER): Payer: BC Managed Care – PPO

## 2022-03-08 DIAGNOSIS — T63441D Toxic effect of venom of bees, accidental (unintentional), subsequent encounter: Secondary | ICD-10-CM | POA: Diagnosis not present

## 2022-03-16 ENCOUNTER — Ambulatory Visit (INDEPENDENT_AMBULATORY_CARE_PROVIDER_SITE_OTHER): Payer: BC Managed Care – PPO

## 2022-03-16 DIAGNOSIS — T63441D Toxic effect of venom of bees, accidental (unintentional), subsequent encounter: Secondary | ICD-10-CM

## 2022-03-23 ENCOUNTER — Ambulatory Visit (INDEPENDENT_AMBULATORY_CARE_PROVIDER_SITE_OTHER): Payer: BC Managed Care – PPO

## 2022-03-23 DIAGNOSIS — T63441D Toxic effect of venom of bees, accidental (unintentional), subsequent encounter: Secondary | ICD-10-CM

## 2022-03-30 ENCOUNTER — Ambulatory Visit: Payer: BC Managed Care – PPO

## 2022-04-10 NOTE — Pre-Procedure Instructions (Addendum)
Surgical Instructions    Your procedure is scheduled on Monday, June 19th, 2023.  Report to [facility] Main Entrance "A" at 7:45 A.M., then check in with the Admitting office.  Call this number if you have problems the morning of surgery:  [phone]   If you have any questions prior to your surgery date call [phone]: Open Monday-Friday 8am-4pm    Remember:  Do not eat or drink after midnight the night before your surgery   Take these medicines the morning of surgery with A SIP OF WATER:   NONE   Take these medicines the morning of surgery AS NEEDED:  Azelastine-Fluticasone  chlorhexidine (PERIDEX)  fexofenadine (ALLEGRA)  EPINEPHrine - Please let hospital know if you take this medication morning of surgery   As of today, STOP taking any Aspirin (unless otherwise instructed by your surgeon) Aleve, Naproxen, Ibuprofen, Motrin, Advil, Goody's, BC's, all herbal medications, fish oil, and all vitamins.   Do not wear jewelry or makeup Do not wear lotions, powders, perfumes/colognes, or deodorant. Do not shave 48 hours prior to surgery.  Men may shave face and neck. Do not bring valuables to the hospital. Do not wear nail polish, gel polish, artificial nails, or any other type of covering on natural nails (fingers and toes) If you have artificial nails or gel coating that need to be removed by a nail salon, please have this removed prior to surgery. Artificial nails or gel coating may interfere with anesthesia's ability to adequately monitor your vital signs.          [facility] is not responsible for any belongings or valuables. .   Do NOT Smoke (Tobacco/Vaping)  24 hours prior to your procedure  If you use a CPAP at night, you may bring your mask for your overnight stay.   Contacts, glasses, hearing aids, dentures or partials may not be worn into surgery, please bring cases for these belongings   For patients admitted to the hospital, discharge time will be determined  by your treatment team.   Patients discharged the day of surgery will not be allowed to drive home, and someone needs to stay with them for 24 hours.   SURGICAL WAITING ROOM VISITATION Patients having surgery or a procedure in a hospital may have two support people. Children under the age of 59 must have an adult with them who is not the patient. They may stay in the waiting area during the procedure and may switch out with other visitors. If the patient needs to stay at the hospital during part of their recovery, the visitor guidelines for inpatient rooms apply.  Please refer to the [facility] website for the visitor guidelines for Inpatients (after your surgery is over and you are in a regular room).    Special instructions:    Oral Hygiene is also important to reduce your risk of infection.  Remember - BRUSH YOUR TEETH THE MORNING OF SURGERY WITH YOUR REGULAR TOOTHPASTE   [facility]- Preparing For Surgery  Before surgery, you can play an important role. Because skin is not sterile, your skin needs to be as free of germs as possible. You can reduce the number of germs on your skin by washing with CHG (chlorahexidine gluconate) Soap before surgery.  CHG is an antiseptic cleaner which kills germs and bonds with the skin to continue killing germs even after washing.     Please do not use if you have an allergy to CHG or antibacterial soaps. If your skin  becomes reddened/irritated stop using the CHG.  Do not shave (including legs and underarms) for at least 48 hours prior to first CHG shower. It is OK to shave your face.  Please follow these instructions carefully.     Shower the NIGHT BEFORE SURGERY and the MORNING OF SURGERY with CHG Soap.   If you chose to wash your hair, wash your hair first as usual with your normal shampoo. After you shampoo, rinse your hair and body thoroughly to remove the shampoo.  Then [employer] and genitals (private parts) with your normal soap and rinse  thoroughly to remove soap.  After that Use CHG Soap as you would any other liquid soap. You can apply CHG directly to the skin and wash gently with a scrungie or a clean washcloth.   Apply the CHG Soap to your body ONLY FROM THE NECK DOWN.  Do not use on open wounds or open sores. Avoid contact with your eyes, ears, mouth and genitals (private parts). Wash Face and genitals (private parts)  with your normal soap.   Wash thoroughly, paying special attention to the area where your surgery will be performed.  Thoroughly rinse your body with warm water from the neck down.  DO NOT shower/wash with your normal soap after using and rinsing off the CHG Soap.  Pat yourself dry with a CLEAN TOWEL.  Wear CLEAN PAJAMAS to bed the night before surgery  Place CLEAN SHEETS on your bed the night before your surgery  DO NOT SLEEP WITH PETS.   Day of Surgery:  Take a shower with CHG soap. Wear Clean/Comfortable clothing the morning of surgery Do not apply any deodorants/lotions.   Remember to brush your teeth WITH YOUR REGULAR TOOTHPASTE.     If you received a COVID test during your pre-op visit, it is requested that you wear a mask when out in public, stay away from anyone that may not be feeling well, and notify your surgeon if you develop symptoms. If you have been in contact with anyone that has tested positive in the last 10 days, please notify your surgeon.    Please read over the following fact sheets that you were given.

## 2022-04-11 ENCOUNTER — Encounter (HOSPITAL_COMMUNITY): Payer: Self-pay

## 2022-04-11 ENCOUNTER — Encounter (HOSPITAL_COMMUNITY)
Admission: RE | Admit: 2022-04-11 | Discharge: 2022-04-11 | Disposition: A | Discharge status: Home / Self Care | Payer: BC Managed Care – PPO | Source: Ambulatory Visit | Attending: Neurosurgery | Admitting: Neurosurgery

## 2022-04-11 ENCOUNTER — Ambulatory Visit (INDEPENDENT_AMBULATORY_CARE_PROVIDER_SITE_OTHER): Payer: BC Managed Care – PPO

## 2022-04-11 ENCOUNTER — Other Ambulatory Visit: Payer: Self-pay

## 2022-04-11 VITALS — BP 149/78 | HR 50 | Temp 97.8°F | Ht 73.0 in | Wt 159.8 lb

## 2022-04-11 DIAGNOSIS — T63441D Toxic effect of venom of bees, accidental (unintentional), subsequent encounter: Secondary | ICD-10-CM | POA: Diagnosis not present

## 2022-04-11 DIAGNOSIS — Z01818 Encounter for other preprocedural examination: Secondary | ICD-10-CM

## 2022-04-11 DIAGNOSIS — M47812 Spondylosis without myelopathy or radiculopathy, cervical region: Secondary | ICD-10-CM | POA: Diagnosis not present

## 2022-04-11 DIAGNOSIS — M47892 Other spondylosis, cervical region: Secondary | ICD-10-CM | POA: Diagnosis not present

## 2022-04-11 DIAGNOSIS — R0789 Other chest pain: Secondary | ICD-10-CM | POA: Diagnosis not present

## 2022-04-11 DIAGNOSIS — M4802 Spinal stenosis, cervical region: Secondary | ICD-10-CM | POA: Insufficient documentation

## 2022-04-11 DIAGNOSIS — I471 Supraventricular tachycardia: Secondary | ICD-10-CM | POA: Insufficient documentation

## 2022-04-11 DIAGNOSIS — Z01812 Encounter for preprocedural laboratory examination: Secondary | ICD-10-CM | POA: Insufficient documentation

## 2022-04-11 HISTORY — DX: Gastro-esophageal reflux disease without esophagitis: K21.9

## 2022-04-11 HISTORY — DX: Unspecified osteoarthritis, unspecified site: M19.90

## 2022-04-11 LAB — CBC
HCT: 44.4 % (ref 39.0–52.0)
Hemoglobin: 14.9 g/dL (ref 13.0–17.0)
MCH: 29.8 pg (ref 26.0–34.0)
MCHC: 33.6 g/dL (ref 30.0–36.0)
MCV: 88.8 fL (ref 80.0–100.0)
Platelets: 271 10*3/uL (ref 150–400)
RBC: 5 MIL/uL (ref 4.22–5.81)
RDW: 12.2 % (ref 11.5–15.5)
WBC: 6.4 10*3/uL (ref 4.0–10.5)
nRBC: 0 % (ref 0.0–0.2)

## 2022-04-11 LAB — SURGICAL PCR SCREEN

## 2022-04-11 NOTE — Pre-Procedure Instructions (Signed)
Surgical Instructions    Your procedure is scheduled on Monday, June 19th, 2023.  Report to [facility] Main Entrance "A" at 05:30 A.M., then check in with the Admitting office.  Call this number if you have problems the morning of surgery:  [phone]   If you have any questions prior to your surgery date call [phone]: Open Monday-Friday 8am-4pm    Remember:  Do not eat or drink after midnight the night before your surgery   Take these medicines the morning of surgery with A SIP OF WATER:   NONE   Take these medicines the morning of surgery AS NEEDED:  Azelastine-Fluticasone  chlorhexidine (PERIDEX)  fexofenadine (ALLEGRA)  EPINEPHrine - Please let hospital know if you take this medication morning of surgery   As of today, STOP taking any Aspirin (unless otherwise instructed by your surgeon) Aleve, Naproxen, Ibuprofen, Motrin, Advil, Goody's, BC's, all herbal medications, fish oil, and all vitamins.   Do not wear jewelry or makeup Do not wear lotions, powders, perfumes/colognes, or deodorant. Do not shave 48 hours prior to surgery.  Men may shave face and neck. Do not bring valuables to the hospital. Do not wear nail polish, gel polish, artificial nails, or any other type of covering on natural nails (fingers and toes) If you have artificial nails or gel coating that need to be removed by a nail salon, please have this removed prior to surgery. Artificial nails or gel coating may interfere with anesthesia's ability to adequately monitor your vital signs.          [facility] is not responsible for any belongings or valuables. .   Do NOT Smoke (Tobacco/Vaping)  24 hours prior to your procedure  If you use a CPAP at night, you may bring your mask for your overnight stay.   Contacts, glasses, hearing aids, dentures or partials may not be worn into surgery, please bring cases for these belongings   For patients admitted to the hospital, discharge time will be determined  by your treatment team.   Patients discharged the day of surgery will not be allowed to drive home, and someone needs to stay with them for 24 hours.   SURGICAL WAITING ROOM VISITATION Patients having surgery or a procedure in a hospital may have two support people. Children under the age of 64 must have an adult with them who is not the patient. They may stay in the waiting area during the procedure and may switch out with other visitors. If the patient needs to stay at the hospital during part of their recovery, the visitor guidelines for inpatient rooms apply.  Please refer to the [facility] website for the visitor guidelines for Inpatients (after your surgery is over and you are in a regular room).    Special instructions:    Oral Hygiene is also important to reduce your risk of infection.  Remember - BRUSH YOUR TEETH THE MORNING OF SURGERY WITH YOUR REGULAR TOOTHPASTE   [facility]- Preparing For Surgery  Before surgery, you can play an important role. Because skin is not sterile, your skin needs to be as free of germs as possible. You can reduce the number of germs on your skin by washing with CHG (chlorahexidine gluconate) Soap before surgery.  CHG is an antiseptic cleaner which kills germs and bonds with the skin to continue killing germs even after washing.     Please do not use if you have an allergy to CHG or antibacterial soaps. If your skin  becomes reddened/irritated stop using the CHG.  Do not shave (including legs and underarms) for at least 48 hours prior to first CHG shower. It is OK to shave your face.  Please follow these instructions carefully.     Shower the NIGHT BEFORE SURGERY and the MORNING OF SURGERY with CHG Soap.   If you chose to wash your hair, wash your hair first as usual with your normal shampoo. After you shampoo, rinse your hair and body thoroughly to remove the shampoo.  Then [employer] and genitals (private parts) with your normal soap and rinse  thoroughly to remove soap.  After that Use CHG Soap as you would any other liquid soap. You can apply CHG directly to the skin and wash gently with a scrungie or a clean washcloth.   Apply the CHG Soap to your body ONLY FROM THE NECK DOWN.  Do not use on open wounds or open sores. Avoid contact with your eyes, ears, mouth and genitals (private parts). Wash Face and genitals (private parts)  with your normal soap.   Wash thoroughly, paying special attention to the area where your surgery will be performed.  Thoroughly rinse your body with warm water from the neck down.  DO NOT shower/wash with your normal soap after using and rinsing off the CHG Soap.  Pat yourself dry with a CLEAN TOWEL.  Wear CLEAN PAJAMAS to bed the night before surgery  Place CLEAN SHEETS on your bed the night before your surgery  DO NOT SLEEP WITH PETS.   Day of Surgery:  Take a shower with CHG soap. Wear Clean/Comfortable clothing the morning of surgery Do not apply any deodorants/lotions.   Remember to brush your teeth WITH YOUR REGULAR TOOTHPASTE.     If you received a COVID test during your pre-op visit, it is requested that you wear a mask when out in public, stay away from anyone that may not be feeling well, and notify your surgeon if you develop symptoms. If you have been in contact with anyone that has tested positive in the last 10 days, please notify your surgeon.    Please read over the following fact sheets that you were given.

## 2022-04-11 NOTE — Progress Notes (Signed)
PCP - Dr. Con Memos Cardiologist - Dr. Clarene Critchley  PPM/ICD - n/a Device Orders - n/a Rep Notified - n/a  Chest x-ray - n/a EKG - 10/07/21. Care Everywhere. Tracing requested.  Stress Test - 12/08/19 ECHO - 12/08/19 Cardiac Cath - denies  Sleep Study - denies CPAP - n/a  Diabetes Mellitus- denies  Blood Thinner Instructions: n/a Aspirin Instructions: n/a  ERAS Protcol - No. NPO PRE-SURGERY Ensure or G2- n/a  COVID TEST- n/a   Anesthesia review: Yes. EKG tracing, stress test, and echocardiogram requested from Dr. Marijo File.   Patient denies shortness of breath, fever, cough and chest pain at PAT appointment   All instructions explained to the patient, with a verbal understanding of the material. Patient agrees to go over the instructions while at home for a better understanding.  The opportunity to ask questions was provided.

## 2022-04-12 NOTE — Anesthesia Preprocedure Evaluation (Addendum)
Anesthesia Evaluation  Patient identified by MRN, date of birth, ID band Patient awake    Reviewed: Allergy & Precautions, NPO status , Patient's Chart, lab work & pertinent test results  History of Anesthesia Complications Negative for: history of anesthetic complications  Airway Mallampati: II  TM Distance: >3 FB Neck ROM: Full    Dental  (+) Dental Advisory Given   Pulmonary neg pulmonary ROS,    breath sounds clear to auscultation       Cardiovascular negative cardio ROS   Rhythm:Regular     Neuro/Psych  Neuromuscular disease negative psych ROS   GI/Hepatic Neg liver ROS, GERD  ,  Endo/Other  negative endocrine ROS  Renal/GU negative Renal ROS     Musculoskeletal  (+) Arthritis ,   Abdominal   Peds  Hematology negative hematology ROS (+)   Anesthesia Other Findings   Reproductive/Obstetrics                            Anesthesia Physical Anesthesia Plan  ASA: 1  Anesthesia Plan: General   Post-op Pain Management: Toradol IV (intra-op)* and Ofirmev IV (intra-op)*   Induction: Intravenous  PONV Risk Score and Plan: 2 and Ondansetron and Dexamethasone  Airway Management Planned: Oral ETT  Additional Equipment: None  Intra-op Plan:   Post-operative Plan: Extubation in OR  Informed Consent: I have reviewed the patients History and Physical, chart, labs and discussed the procedure including the risks, benefits and alternatives for the proposed anesthesia with the patient or authorized representative who has indicated his/her understanding and acceptance.       Plan Discussed with:   Anesthesia Plan Comments: (PAT note by Antionette Poles, PA-C: History of SVT (Zio patch done 08/23/2019 showed a few short episodes of SVT, symptoms were correlated with only sinus rhythm), atypical chest pain.  Stress echo 2021 was negative for inducible ischemia, no arrhythmia seen except 1 PAC.   Last seen by Dr. Cameron Ali back 10/07/2021 with complaint of chest and arm discomfort.  Per note, "His symptoms are somewhat atypical a as they are long-lasting and seem to occur more at rest and are not worsened with activity. EKG and exam are unremarkable. We discussed doing a stress test but he would like to hold off for now and see if it goes away on its own. I think this is unlikely to be a significant cardiac problem. If the numbness persists he may want to see a neurologist or orthopedist."  He was advised to follow-up as needed.  Patient did subsequently see neurology and had cervical spine MRI showing multilevel cervical spondylosis with moderate spinal stenosis at C5-6 with severe left foraminal encroachment.  Surgical intervention was recommended.  Preop labs reviewed, unremarkable.  EKG 10/07/2021 (Care Everywhere): Sinus rhythm.  Rate 61.  Rightward axis.  Exercise stress echo 12/08/2019 (Care Everywhere): SUMMARY  Negative stress ECG for inducible ischemia at target heart rate.  Negative exercise echocardiography for inducible ischemia at target  heart rate.  No arrhythmia seen except onePAC  )       Anesthesia Quick Evaluation

## 2022-04-12 NOTE — Progress Notes (Signed)
Anesthesia Chart Review:  History of SVT (Zio patch done 08/23/2019 showed a few short episodes of SVT, symptoms were correlated with only sinus rhythm), atypical chest pain.  Stress echo 2021 was negative for inducible ischemia, no arrhythmia seen except 1 PAC.  Last seen by Dr. Cameron Ali back 10/07/2021 with complaint of chest and arm discomfort.  Per note, "His symptoms are somewhat atypical a as they are long-lasting and seem to occur more at rest and are not worsened with activity. EKG and exam are unremarkable. We discussed doing a stress test but he would like to hold off for now and see if it goes away on its own. I think this is unlikely to be a significant cardiac problem. If the numbness persists he may want to see a neurologist or orthopedist."  He was advised to follow-up as needed.  Patient did subsequently see neurology and had cervical spine MRI showing multilevel cervical spondylosis with moderate spinal stenosis at C5-6 with severe left foraminal encroachment.  Surgical intervention was recommended.  Preop labs reviewed, unremarkable.  EKG 10/07/2021 (Care Everywhere): Sinus rhythm.  Rate 61.  Rightward axis.  Exercise stress echo 12/08/2019 (Care Everywhere): SUMMARY  Negative stress ECG for inducible ischemia at target heart rate.  Negative exercise echocardiography for inducible ischemia at target  heart rate.  No arrhythmia seen except one PAC    Zannie Cove [facility]/Anesthesiology Phone [phone] 04/12/2022 1:26 PM

## 2022-04-12 NOTE — Progress Notes (Signed)
Per lab, surgical PCR was invalid.  Patient will need a repeat surgical PCR on day of surgery.

## 2022-04-17 ENCOUNTER — Encounter (HOSPITAL_COMMUNITY)
Admission: RE | Disposition: A | Discharge status: Home / Self Care | Payer: Self-pay | Source: Home / Self Care | Attending: Neurosurgery

## 2022-04-17 ENCOUNTER — Encounter (HOSPITAL_COMMUNITY): Payer: Self-pay | Admitting: Neurosurgery

## 2022-04-17 ENCOUNTER — Ambulatory Visit (HOSPITAL_COMMUNITY): Payer: BC Managed Care – PPO | Admitting: Anesthesiology

## 2022-04-17 ENCOUNTER — Other Ambulatory Visit: Payer: Self-pay

## 2022-04-17 ENCOUNTER — Ambulatory Visit (HOSPITAL_COMMUNITY): Payer: BC Managed Care – PPO

## 2022-04-17 ENCOUNTER — Ambulatory Visit (HOSPITAL_COMMUNITY): Payer: BC Managed Care – PPO | Admitting: Physician Assistant

## 2022-04-17 ENCOUNTER — Ambulatory Visit (HOSPITAL_COMMUNITY)
Admission: RE | Admit: 2022-04-17 | Discharge: 2022-04-17 | Disposition: A | Discharge status: Home / Self Care | Payer: BC Managed Care – PPO | Attending: Neurosurgery | Admitting: Neurosurgery

## 2022-04-17 DIAGNOSIS — Z01818 Encounter for other preprocedural examination: Secondary | ICD-10-CM

## 2022-04-17 DIAGNOSIS — M4722 Other spondylosis with radiculopathy, cervical region: Secondary | ICD-10-CM | POA: Diagnosis not present

## 2022-04-17 DIAGNOSIS — M4802 Spinal stenosis, cervical region: Secondary | ICD-10-CM | POA: Diagnosis not present

## 2022-04-17 DIAGNOSIS — G709 Myoneural disorder, unspecified: Secondary | ICD-10-CM | POA: Insufficient documentation

## 2022-04-17 DIAGNOSIS — M1990 Unspecified osteoarthritis, unspecified site: Secondary | ICD-10-CM | POA: Diagnosis not present

## 2022-04-17 HISTORY — PX: ANTERIOR CERVICAL DECOMP/DISCECTOMY FUSION: SHX1161

## 2022-04-17 LAB — SURGICAL PCR SCREEN
MRSA, PCR: NEGATIVE
Staphylococcus aureus: NEGATIVE

## 2022-04-17 SURGERY — ANTERIOR CERVICAL DECOMPRESSION/DISCECTOMY FUSION 2 LEVELS
Anesthesia: General

## 2022-04-17 MED ORDER — FENTANYL CITRATE (PF) 100 MCG/2ML IJ SOLN: 25.0000 ug | INTRAMUSCULAR | Status: DC | PRN

## 2022-04-17 MED ORDER — LACTATED RINGERS IV SOLN: INTRAVENOUS | Status: DC

## 2022-04-17 MED ORDER — HYDROMORPHONE HCL 1 MG/ML IJ SOLN: 0.5000 mg | INTRAMUSCULAR | Status: DC | PRN

## 2022-04-17 MED ORDER — HYDROCODONE-ACETAMINOPHEN 5-325 MG PO TABS
2.0000 | ORAL_TABLET | ORAL | Status: DC | PRN
  Administered 2022-04-17: 2 via ORAL
  Filled 2022-04-17: qty 2

## 2022-04-17 MED ORDER — EPHEDRINE SULFATE-NACL 50-0.9 MG/10ML-% IV SOSY
PREFILLED_SYRINGE | INTRAVENOUS | Status: DC | PRN
  Administered 2022-04-17: 5 mg via INTRAVENOUS
  Administered 2022-04-17: 10 mg via INTRAVENOUS

## 2022-04-17 MED ORDER — DEXAMETHASONE SODIUM PHOSPHATE 10 MG/ML IJ SOLN
INTRAMUSCULAR | Status: AC
  Filled 2022-04-17: qty 1

## 2022-04-17 MED ORDER — SUGAMMADEX SODIUM 200 MG/2ML IV SOLN
INTRAVENOUS | Status: DC | PRN
  Administered 2022-04-17: 200 mg via INTRAVENOUS

## 2022-04-17 MED ORDER — ALUM & MAG HYDROXIDE-SIMETH 200-200-20 MG/5ML PO SUSP
30.0000 mL | Freq: Four times a day (QID) | ORAL | Status: DC | PRN
Start: 2022-04-17 — End: 2022-04-17

## 2022-04-17 MED ORDER — MIDAZOLAM HCL 5 MG/5ML IJ SOLN
INTRAMUSCULAR | Status: DC | PRN
  Administered 2022-04-17: 2 mg via INTRAVENOUS

## 2022-04-17 MED ORDER — CHLORHEXIDINE GLUCONATE CLOTH 2 % EX PADS: 6.0000 | MEDICATED_PAD | Freq: Once | CUTANEOUS | Status: DC

## 2022-04-17 MED ORDER — THROMBIN 5000 UNITS EX SOLR
OROMUCOSAL | Status: DC | PRN
  Administered 2022-04-17: 5 mL via TOPICAL

## 2022-04-17 MED ORDER — ACETAMINOPHEN 325 MG PO TABS: 650.0000 mg | ORAL_TABLET | ORAL | Status: DC | PRN

## 2022-04-17 MED ORDER — KETOROLAC TROMETHAMINE 30 MG/ML IJ SOLN
INTRAMUSCULAR | Status: AC
  Filled 2022-04-17: qty 1

## 2022-04-17 MED ORDER — PROPOFOL 10 MG/ML IV BOLUS
INTRAVENOUS | Status: AC
  Filled 2022-04-17: qty 20

## 2022-04-17 MED ORDER — MENTHOL 3 MG MT LOZG
1.0000 | LOZENGE | OROMUCOSAL | Status: DC | PRN
  Administered 2022-04-17: 3 mg via ORAL
  Filled 2022-04-17: qty 9

## 2022-04-17 MED ORDER — ACETAMINOPHEN 10 MG/ML IV SOLN
INTRAVENOUS | Status: DC | PRN
  Administered 2022-04-17: 1000 mg via INTRAVENOUS

## 2022-04-17 MED ORDER — AZELASTINE-FLUTICASONE 137-50 MCG/ACT NA SUSP
1.0000 | Freq: Every day | NASAL | Status: DC | PRN
Start: 2022-04-17 — End: 2022-04-17

## 2022-04-17 MED ORDER — EPINEPHRINE 0.3 MG/0.3ML IJ SOAJ: 0.3000 mg | INTRAMUSCULAR | Status: DC | PRN

## 2022-04-17 MED ORDER — CHLORHEXIDINE GLUCONATE 0.12 % MT SOLN
15.0000 mL | Freq: Once | OROMUCOSAL | Status: DC
  Filled 2022-04-17: qty 15

## 2022-04-17 MED ORDER — OXYCODONE HCL 5 MG PO TABS: 5.0000 mg | ORAL_TABLET | Freq: Once | ORAL | Status: DC | PRN

## 2022-04-17 MED ORDER — FENTANYL CITRATE (PF) 250 MCG/5ML IJ SOLN
INTRAMUSCULAR | Status: AC
  Filled 2022-04-17: qty 5

## 2022-04-17 MED ORDER — KETOCONAZOLE 2 % EX SHAM
1.0000 | MEDICATED_SHAMPOO | Freq: Every day | CUTANEOUS | Status: DC | PRN
Start: 2022-04-17 — End: 2022-04-17

## 2022-04-17 MED ORDER — MONTELUKAST SODIUM 10 MG PO TABS: 10.0000 mg | ORAL_TABLET | Freq: Every day | ORAL | Status: DC

## 2022-04-17 MED ORDER — PHENOL 1.4 % MT LIQD
1.0000 | OROMUCOSAL | Status: DC | PRN
Start: 2022-04-17 — End: 2022-04-17

## 2022-04-17 MED ORDER — LIDOCAINE 2% (20 MG/ML) 5 ML SYRINGE
INTRAMUSCULAR | Status: AC
Start: 2022-04-17 — End: ?
  Filled 2022-04-17: qty 5

## 2022-04-17 MED ORDER — CHLORHEXIDINE GLUCONATE 0.12 % MT SOLN
15.0000 mL | Freq: Every day | OROMUCOSAL | Status: DC | PRN
Start: 2022-04-17 — End: 2022-04-17

## 2022-04-17 MED ORDER — ACETAMINOPHEN 650 MG RE SUPP: 650.0000 mg | RECTAL | Status: DC | PRN

## 2022-04-17 MED ORDER — CYCLOBENZAPRINE HCL 10 MG PO TABS: 10.0000 mg | ORAL_TABLET | Freq: Three times a day (TID) | ORAL | Status: DC | PRN

## 2022-04-17 MED ORDER — CEFAZOLIN SODIUM-DEXTROSE 2-4 GM/100ML-% IV SOLN
2.0000 g | Freq: Three times a day (TID) | INTRAVENOUS | Status: DC
  Administered 2022-04-17: 2 g via INTRAVENOUS
  Filled 2022-04-17: qty 100

## 2022-04-17 MED ORDER — PROPOFOL 10 MG/ML IV BOLUS
INTRAVENOUS | Status: DC | PRN
  Administered 2022-04-17: 120 mg via INTRAVENOUS

## 2022-04-17 MED ORDER — SODIUM CHLORIDE 0.9 % IV SOLN
250.0000 mL | INTRAVENOUS | Status: DC
  Administered 2022-04-17: 250 mL via INTRAVENOUS

## 2022-04-17 MED ORDER — OXYCODONE HCL 5 MG/5ML PO SOLN: 5.0000 mg | Freq: Once | ORAL | Status: DC | PRN

## 2022-04-17 MED ORDER — LIDOCAINE 2% (20 MG/ML) 5 ML SYRINGE
INTRAMUSCULAR | Status: DC | PRN
  Administered 2022-04-17: 60 mg via INTRAVENOUS

## 2022-04-17 MED ORDER — LORATADINE 10 MG PO TABS: 10.0000 mg | ORAL_TABLET | Freq: Every day | ORAL | Status: DC

## 2022-04-17 MED ORDER — THROMBIN 5000 UNITS EX SOLR
CUTANEOUS | Status: DC | PRN
  Administered 2022-04-17: 10000 [IU] via TOPICAL

## 2022-04-17 MED ORDER — PANTOPRAZOLE SODIUM 40 MG IV SOLR: 40.0000 mg | Freq: Every day | INTRAVENOUS | Status: DC

## 2022-04-17 MED ORDER — 0.9 % SODIUM CHLORIDE (POUR BTL) OPTIME
TOPICAL | Status: DC | PRN
  Administered 2022-04-17 (×2): 1000 mL

## 2022-04-17 MED ORDER — ORAL CARE MOUTH RINSE: 15.0000 mL | Freq: Once | OROMUCOSAL | Status: DC

## 2022-04-17 MED ORDER — THROMBIN 5000 UNITS EX SOLR
CUTANEOUS | Status: AC
  Filled 2022-04-17: qty 15000

## 2022-04-17 MED ORDER — MIDAZOLAM HCL 2 MG/2ML IJ SOLN
INTRAMUSCULAR | Status: AC
  Filled 2022-04-17: qty 2

## 2022-04-17 MED ORDER — CYCLOBENZAPRINE HCL 10 MG PO TABS: 10.0000 mg | ORAL_TABLET | Freq: Three times a day (TID) | ORAL | 0 refills | Status: AC | PRN

## 2022-04-17 MED ORDER — ROCURONIUM BROMIDE 10 MG/ML (PF) SYRINGE
PREFILLED_SYRINGE | INTRAVENOUS | Status: DC | PRN
  Administered 2022-04-17: 70 mg via INTRAVENOUS
  Administered 2022-04-17: 30 mg via INTRAVENOUS

## 2022-04-17 MED ORDER — ACETAMINOPHEN 10 MG/ML IV SOLN
INTRAVENOUS | Status: AC
  Filled 2022-04-17: qty 100

## 2022-04-17 MED ORDER — KETOROLAC TROMETHAMINE 30 MG/ML IJ SOLN
INTRAMUSCULAR | Status: DC | PRN
  Administered 2022-04-17: 30 mg via INTRAVENOUS

## 2022-04-17 MED ORDER — ONDANSETRON HCL 4 MG PO TABS: 4.0000 mg | ORAL_TABLET | Freq: Four times a day (QID) | ORAL | Status: DC | PRN

## 2022-04-17 MED ORDER — SODIUM CHLORIDE 0.9% FLUSH: 3.0000 mL | Freq: Two times a day (BID) | INTRAVENOUS | Status: DC

## 2022-04-17 MED ORDER — ROCURONIUM BROMIDE 10 MG/ML (PF) SYRINGE
PREFILLED_SYRINGE | INTRAVENOUS | Status: AC
Start: 2022-04-17 — End: ?
  Filled 2022-04-17: qty 10

## 2022-04-17 MED ORDER — ACETAMINOPHEN 160 MG/5ML PO SOLN: 1000.0000 mg | Freq: Once | ORAL | Status: DC | PRN

## 2022-04-17 MED ORDER — DEXAMETHASONE SODIUM PHOSPHATE 10 MG/ML IJ SOLN
INTRAMUSCULAR | Status: DC | PRN
  Administered 2022-04-17: 10 mg via INTRAVENOUS

## 2022-04-17 MED ORDER — ONDANSETRON HCL 4 MG/2ML IJ SOLN: 4.0000 mg | Freq: Four times a day (QID) | INTRAMUSCULAR | Status: DC | PRN

## 2022-04-17 MED ORDER — SODIUM CHLORIDE 0.9% FLUSH: 3.0000 mL | INTRAVENOUS | Status: DC | PRN

## 2022-04-17 MED ORDER — FENTANYL CITRATE (PF) 250 MCG/5ML IJ SOLN
INTRAMUSCULAR | Status: DC | PRN
  Administered 2022-04-17: 50 ug via INTRAVENOUS
  Administered 2022-04-17: 75 ug via INTRAVENOUS
  Administered 2022-04-17: 25 ug via INTRAVENOUS
  Administered 2022-04-17: 100 ug via INTRAVENOUS

## 2022-04-17 MED ORDER — PHENYLEPHRINE HCL-NACL 20-0.9 MG/250ML-% IV SOLN
INTRAVENOUS | Status: DC | PRN
  Administered 2022-04-17: 20 ug/min via INTRAVENOUS

## 2022-04-17 MED ORDER — ACETAMINOPHEN 500 MG PO TABS: 1000.0000 mg | ORAL_TABLET | Freq: Once | ORAL | Status: DC | PRN

## 2022-04-17 MED ORDER — ONDANSETRON HCL 4 MG/2ML IJ SOLN
INTRAMUSCULAR | Status: DC | PRN
  Administered 2022-04-17: 4 mg via INTRAVENOUS

## 2022-04-17 MED ORDER — HEMOSTATIC AGENTS (NO CHARGE) OPTIME
TOPICAL | Status: DC | PRN
  Administered 2022-04-17: 1 via TOPICAL

## 2022-04-17 MED ORDER — ACETAMINOPHEN 10 MG/ML IV SOLN: 1000.0000 mg | Freq: Once | INTRAVENOUS | Status: DC | PRN

## 2022-04-17 MED ORDER — TADALAFIL 2.5 MG PO TABS: 2.5000 mg | ORAL_TABLET | Freq: Every day | ORAL | Status: DC

## 2022-04-17 MED ORDER — CEFAZOLIN SODIUM-DEXTROSE 2-4 GM/100ML-% IV SOLN
2.0000 g | INTRAVENOUS | Status: AC
  Administered 2022-04-17: 2 g via INTRAVENOUS
  Filled 2022-04-17: qty 100

## 2022-04-17 MED ORDER — HYDROCODONE-ACETAMINOPHEN 5-325 MG PO TABS: 2.0000 | ORAL_TABLET | ORAL | 0 refills | Status: AC | PRN

## 2022-04-17 MED ORDER — ONDANSETRON HCL 4 MG/2ML IJ SOLN
INTRAMUSCULAR | Status: AC
  Filled 2022-04-17: qty 2

## 2022-04-17 SURGICAL SUPPLY — 61 items
BAG COUNTER SPONGE SURGICOUNT (BAG) ×3 IMPLANT
BAND RUBBER #18 3X1/16 STRL (MISCELLANEOUS) ×4 IMPLANT
BASKET BONE COLLECTION (BASKET) ×2 IMPLANT
BENZOIN TINCTURE PRP APPL 2/3 (GAUZE/BANDAGES/DRESSINGS) ×2 IMPLANT
BIT DRILL NEURO 2X3.1 SFT TUCH (MISCELLANEOUS) ×1 IMPLANT
BONE VIVIGEN FORMABLE 1.3CC (Bone Implant) ×2 IMPLANT
BUR MATCHSTICK NEURO 3.0 LAGG (BURR) ×2 IMPLANT
CANISTER SUCT 3000ML PPV (MISCELLANEOUS) ×2 IMPLANT
CARTRIDGE OIL MAESTRO DRILL (MISCELLANEOUS) ×1 IMPLANT
DERMABOND ADHESIVE PROPEN (GAUZE/BANDAGES/DRESSINGS) ×1
DERMABOND ADVANCED (GAUZE/BANDAGES/DRESSINGS)
DERMABOND ADVANCED .7 DNX12 (GAUZE/BANDAGES/DRESSINGS) IMPLANT
DERMABOND ADVANCED .7 DNX6 (GAUZE/BANDAGES/DRESSINGS) IMPLANT
DIFFUSER DRILL AIR PNEUMATIC (MISCELLANEOUS) ×2 IMPLANT
DRAPE C-ARM 42X72 X-RAY (DRAPES) ×4 IMPLANT
DRAPE LAPAROTOMY 100X72 PEDS (DRAPES) ×2 IMPLANT
DRAPE MICROSCOPE LEICA (MISCELLANEOUS) ×2 IMPLANT
DRILL NEURO 2X3.1 SOFT TOUCH (MISCELLANEOUS) ×2
DRSG OPSITE POSTOP 4X6 (GAUZE/BANDAGES/DRESSINGS) ×1 IMPLANT
DURAPREP 6ML APPLICATOR 50/CS (WOUND CARE) ×2 IMPLANT
ELECT COATED BLADE 2.86 ST (ELECTRODE) ×2 IMPLANT
ELECT REM PT RETURN 9FT ADLT (ELECTROSURGICAL) ×2
ELECTRODE REM PT RTRN 9FT ADLT (ELECTROSURGICAL) ×1 IMPLANT
GAUZE 4X4 16PLY ~~LOC~~+RFID DBL (SPONGE) IMPLANT
GAUZE SPONGE 4X4 12PLY STRL (GAUZE/BANDAGES/DRESSINGS) ×2 IMPLANT
GLOVE BIO SURGEON STRL SZ7 (GLOVE) ×1 IMPLANT
GLOVE BIO SURGEON STRL SZ8 (GLOVE) ×2 IMPLANT
GLOVE BIOGEL PI IND STRL 7.0 (GLOVE) IMPLANT
GLOVE BIOGEL PI INDICATOR 7.0 (GLOVE) ×1
GLOVE EXAM NITRILE XL STR (GLOVE) IMPLANT
GLOVE INDICATOR 8.5 STRL (GLOVE) ×4 IMPLANT
GOWN STRL REUS W/ TWL LRG LVL3 (GOWN DISPOSABLE) IMPLANT
GOWN STRL REUS W/ TWL XL LVL3 (GOWN DISPOSABLE) ×1 IMPLANT
GOWN STRL REUS W/TWL 2XL LVL3 (GOWN DISPOSABLE) IMPLANT
GOWN STRL REUS W/TWL LRG LVL3 (GOWN DISPOSABLE) ×2
GOWN STRL REUS W/TWL XL LVL3 (GOWN DISPOSABLE) ×1
GRAFT BNE MATRIX VG FRMBL SM 1 (Bone Implant) IMPLANT
HALTER HD/CHIN CERV TRACTION D (MISCELLANEOUS) ×2 IMPLANT
HEMOSTAT POWDER KIT SURGIFOAM (HEMOSTASIS) ×2 IMPLANT
KIT BASIN OR (CUSTOM PROCEDURE TRAY) ×2 IMPLANT
KIT TURNOVER KIT B (KITS) ×2 IMPLANT
NDL SPNL 20GX3.5 QUINCKE YW (NEEDLE) ×1 IMPLANT
NEEDLE SPNL 20GX3.5 QUINCKE YW (NEEDLE) ×2 IMPLANT
NS IRRIG 1000ML POUR BTL (IV SOLUTION) ×3 IMPLANT
OIL CARTRIDGE MAESTRO DRILL (MISCELLANEOUS) ×2
PACK LAMINECTOMY NEURO (CUSTOM PROCEDURE TRAY) ×2 IMPLANT
PAD ARMBOARD 7.5X6 YLW CONV (MISCELLANEOUS) ×6 IMPLANT
PIN DISTRACTION 14MM (PIN) IMPLANT
PLATE CERV RES 30 2L (Plate) ×1 IMPLANT
SCREW VA SD RESONATE 4.2X14 (Screw) ×6 IMPLANT
SPACER HEDRON 14X16X6 0D (Spacer) ×1 IMPLANT
SPACER HEDRON 14X16X8 0D (Spacer) ×1 IMPLANT
SPONGE INTESTINAL PEANUT (DISPOSABLE) ×2 IMPLANT
SPONGE SURGIFOAM ABS GEL SZ50 (HEMOSTASIS) ×2 IMPLANT
STRIP CLOSURE SKIN 1/2X4 (GAUZE/BANDAGES/DRESSINGS) ×2 IMPLANT
SUT VIC AB 3-0 SH 8-18 (SUTURE) ×2 IMPLANT
SUT VICRYL 4-0 PS2 18IN ABS (SUTURE) ×2 IMPLANT
TAPE CLOTH 4X10 WHT NS (GAUZE/BANDAGES/DRESSINGS) ×2 IMPLANT
TOWEL GREEN STERILE (TOWEL DISPOSABLE) ×2 IMPLANT
TOWEL GREEN STERILE FF (TOWEL DISPOSABLE) ×2 IMPLANT
WATER STERILE IRR 1000ML POUR (IV SOLUTION) ×2 IMPLANT

## 2022-04-17 NOTE — Plan of Care (Signed)
  Problem: Skin Integrity: Goal: Risk for impaired skin integrity will decrease Outcome: Completed/Met   Problem: Education: Goal: Ability to verbalize activity precautions or restrictions will improve Outcome: Completed/Met Goal: Knowledge of the prescribed therapeutic regimen will improve Outcome: Completed/Met Goal: Understanding of discharge needs will improve Outcome: Completed/Met   Problem: Activity: Goal: Ability to avoid complications of mobility impairment will improve Outcome: Completed/Met Goal: Ability to tolerate increased activity will improve Outcome: Completed/Met Goal: Will remain free from falls Outcome: Completed/Met   Problem: Bowel/Gastric: Goal: Gastrointestinal status for postoperative course will improve Outcome: Completed/Met   Problem: Clinical Measurements: Goal: Ability to maintain clinical measurements within normal limits will improve Outcome: Completed/Met Goal: Postoperative complications will be avoided or minimized Outcome: Completed/Met Goal: Diagnostic test results will improve Outcome: Completed/Met   Problem: Pain Management: Goal: Pain level will decrease Outcome: Completed/Met   Problem: Skin Integrity: Goal: Will show signs of wound healing Outcome: Completed/Met   Problem: Health Behavior/Discharge Planning: Goal: Identification of resources available to assist in meeting health care needs will improve Outcome: Completed/Met   Problem: Bladder/Genitourinary: Goal: Urinary functional status for postoperative course will improve Outcome: Completed/Met   

## 2022-04-17 NOTE — Discharge Instructions (Signed)
Wound Care  Keep the incision clean and dry remove the outer dressing in 3 days, leave the Steri-Strips intact. Wrap with Saran wrap for showers only Do not put any creams, lotions, or ointments on incision. Leave steri-strips on neck.  They will fall off by themselves.  Activity Walk each and every day, increasing distance each day. No lifting greater than 5 lbs.  Avoid excessive neck motion. No lifting no bending no twisting no driving or riding a car unless coming back and forth to see me. Wear neck brace at all times except when showering.   Diet Resume your normal diet.   Return to Work Will be discussed at you follow up appointment.  Call Your Doctor If Any of These Occur Redness, drainage, or swelling at the wound.  Temperature greater than 101 degrees. Severe pain not relieved by pain medication. Incision starts to come apart. Follow Up Appt Call today for appointment in 1-2 weeks ([phone]) or for problems.  If you have any hardware placed in your spine, you will need an x-ray before your appointment.

## 2022-04-17 NOTE — Transfer of Care (Signed)
Immediate Anesthesia Transfer of Care Note  Patient: Joshua Sellers  Procedure(s) Performed: Anterior Cervical Decompression Fusion Cervical five-six Cervical six-seven  Patient Location: PACU  Anesthesia Type:General  Level of Consciousness: awake, alert  and oriented  Airway & Oxygen Therapy: Patient Spontanous Breathing  Post-op Assessment: Report given to RN, Post -op Vital signs reviewed and stable and Patient moving all extremities X 4  Post vital signs: Reviewed and stable  Last Vitals:  Vitals Value Taken Time  BP 128/61 04/17/22 1012  Temp 36.4 C 04/17/22 1012  Pulse 65 04/17/22 1016  Resp 11 04/17/22 1016  SpO2 95 % 04/17/22 1016  Vitals shown include unvalidated device data.  Last Pain:  Vitals:   04/17/22 1012  TempSrc:   PainSc: 4          Complications: No notable events documented.

## 2022-04-17 NOTE — Discharge Summary (Signed)
  Physician Discharge Summary  Patient ID: Joshua Sellers MRN: [id number] DOB/AGE: 01/01/1961 61 y.o. Estimated body mass index is 20.32 kg/m as calculated from the following:   Height as of this encounter: 6\' 1"  (1.854 m).   Weight as of this encounter: 69.9 kg.   Admit date: 04/17/2022 Discharge date: 04/17/2022  Admission Diagnoses: Cervical spondylosis with radiculopathy  Discharge Diagnoses: Same Principal Problem:   Spinal stenosis in cervical region   Discharged Condition: good  Hospital Course: Patient admitted to the hospital underwent anterior cervical discectomies and fusion at C5-6 and C6-7.  Postoperatively patient did very well covering the floor on the floor was ambulating and voiding spontaneously tolerating regular diet and stable for discharge home.  Patient will discharge schedule follow-up in 1 to 2 weeks.  Consults: Significant Diagnostic Studies: Treatments: ACDF C5-6 C6-7 Discharge Exam: Blood pressure 134/77, pulse 67, temperature 98 F (36.7 C), resp. rate 18, height 6\' 1"  (1.854 m), weight 69.9 kg, SpO2 98 %. Strength 5 out of 5 wound clean dry and intact  Disposition: Home   Allergies as of 04/17/2022       Reactions   Bee Venom Hives, Itching, Swelling, Rash, Other (See Comments)   Swelling    Poison Oak Extract Hives, Rash   Pollen Extract Other (See Comments)        Medication List     TAKE these medications    Azelastine-Fluticasone 137-50 MCG/ACT Susp Place 1 spray into the nose in the morning and at bedtime. What changed:  when to take this reasons to take this   chlorhexidine 0.12 % solution Commonly known as: PERIDEX Use as directed 15 mLs in the mouth or throat daily as needed (oral irritation).   cyclobenzaprine 10 MG tablet Commonly known as: FLEXERIL Take 1 tablet (10 mg total) by mouth 3 (three) times daily as needed for muscle spasms.   EPINEPHrine 0.3 mg/0.3 mL Soaj injection Commonly known as: EPI-PEN Inject  0.3 mg into the muscle as needed for anaphylaxis.   fexofenadine 180 MG tablet Commonly known as: ALLEGRA Take 180 mg by mouth daily as needed for allergies.   HYDROcodone-acetaminophen 5-325 MG tablet Commonly known as: NORCO/VICODIN Take 2 tablets by mouth every 4 (four) hours as needed for severe pain ((score 7 to 10)).   ketoconazole 2 % shampoo Commonly known as: NIZORAL Apply 1 application. topically daily as needed for irritation.   montelukast 10 MG tablet Commonly known as: SINGULAIR Take 10 mg by mouth at bedtime.   Tadalafil 2.5 MG Tabs Take 2.5 mg by mouth daily.         Signed: 04/17/2022, 3:29 PM

## 2022-04-17 NOTE — Progress Notes (Signed)
Patient alert and oriented, mae's well, voiding adequate amount of urine, swallowing without difficulty, no c/o pain at time of discharge. Patient discharged home with family. Script and discharged instructions given to patient. Patient and family stated understanding of instructions given. Patient has an appointment with Dr. Cram 

## 2022-04-17 NOTE — Evaluation (Addendum)
Occupational Therapy Evaluation Patient Details Name: Joshua Sellers MRN: [id number] DOB: [birth year] Today's Date: 04/17/2022   History of Present Illness Pt is a 61 y/o M s/p C5-6, C6-7 ACDF on 6/19. PMH includes angioedema, arthritis, GERD, and kidney stones.   Clinical Impression   Pt independent at baseline with ADLs and functional mobility, lives with spouse who can provide assist at d/c. Pt educated on compensatory strategies for ADLs, bed mobility, precautions, and management of soft collar. Pt verbalized and demo'd understanding, able to complete independently during session. Pt completed hallway distance ambulation and stair navigation independently. Pt presenting with impairments listed below, however has no acute OT needs at this time, will s/o. Recommend d/c home with family assistance.     Recommendations for follow up therapy are one component of a multi-disciplinary discharge planning process, led by the attending physician.  Recommendations may be updated based on patient status, additional functional criteria and insurance authorization.   Follow Up Recommendations  No OT follow up    Assistance Recommended at Discharge PRN  Patient can return home with the following Assist for transportation    Functional Status Assessment  Patient has had a recent decline in their functional status and demonstrates the ability to make significant improvements in function in a reasonable and predictable amount of time.  Equipment Recommendations  None recommended by OT    Recommendations for Other Services       Precautions / Restrictions Precautions Precautions: Cervical Precaution Booklet Issued: Yes (comment) Precaution Comments: educated pt on 3/3 cervical precautions Required Braces or Orthoses: Cervical Brace Cervical Brace: Soft collar;At all times Restrictions Weight Bearing Restrictions: No      Mobility Bed Mobility Overal bed mobility: Independent              General bed mobility comments: use of log roll technique    Transfers Overall transfer level: Independent Equipment used: None               General transfer comment: ind with hallway ambulation and stair navigation      Balance Overall balance assessment: No apparent balance deficits (not formally assessed)                                         ADL either performed or assessed with clinical judgement   ADL Overall ADL's : At baseline                                       General ADL Comments: demonstrates ADLs independently with use of compensatory techniques     Vision Baseline Vision/History: 1 Wears glasses Vision Assessment?: No apparent visual deficits     Perception     Praxis      Pertinent Vitals/Pain Pain Assessment Pain Assessment: No/denies pain     Hand Dominance     Extremity/Trunk Assessment Upper Extremity Assessment Upper Extremity Assessment: Overall WFL for tasks assessed   Lower Extremity Assessment Lower Extremity Assessment: Overall WFL for tasks assessed   Cervical / Trunk Assessment Cervical / Trunk Assessment: Neck Surgery   Communication Communication Communication: No difficulties   Cognition Arousal/Alertness: Awake/alert Behavior During Therapy: WFL for tasks assessed/performed Overall Cognitive Status: Within Functional Limits for tasks assessed  General Comments  VSS on RA, spouse present in room    Exercises     Shoulder Instructions      Home Living Family/patient expects to be discharged to:: Private residence Living Arrangements: Spouse/significant other Available Help at Discharge: Family Type of Home: House Home Access: Stairs to enter Secretary/administrator of Steps: 2   Home Layout: Able to live on main level with bedroom/bathroom;Multi-level     Bathroom Shower/Tub: Producer, television/film/video:  Standard     Home Equipment: Crutches          Prior Functioning/Environment Prior Level of Function : Independent/Modified Independent             Mobility Comments: no AD use ADLs Comments: reports biking 200 miles/week        OT Problem List: Decreased range of motion      OT Treatment/Interventions:      OT Goals(Current goals can be found in the care plan section) Acute Rehab OT Goals Patient Stated Goal: to go home OT Goal Formulation: With patient Time For Goal Achievement: 05/01/22 Potential to Achieve Goals: Good  OT Frequency:      Co-evaluation              AM-PAC OT "6 Clicks" Daily Activity     Outcome Measure Help from another person eating meals?: None Help from another person taking care of personal grooming?: None Help from another person toileting, which includes using toliet, bedpan, or urinal?: None Help from another person bathing (including washing, rinsing, drying)?: None Help from another person to put on and taking off regular upper body clothing?: None Help from another person to put on and taking off regular lower body clothing?: None 6 Click Score: 24   End of Session Equipment Utilized During Treatment: Cervical collar Nurse Communication: Mobility status  Activity Tolerance: Patient tolerated treatment well Patient left: in bed;with call bell/phone within reach;with family/visitor present  OT Visit Diagnosis: Muscle weakness (generalized) (M62.81)                Time: 9629-5284 OT Time Calculation (min): 21 min Charges:  OT General Charges $OT Visit: 1 Visit OT Evaluation $OT Eval Low Complexity: 1 Low  Joshua Sellers, OTD, OTR/L Acute Rehab [phone]  Joshua Sellers 04/17/2022, 4:17 PM

## 2022-04-17 NOTE — H&P (Signed)
Joshua Sellers is an 61 y.o. male.   Chief Complaint: Neck and left greater than right arm pain HPI: 61 year old gentleman with neck or and left arm pain primarily patient failed all forms conservative treatment anti-inflammatories physical therapy and steroids due to patient's progression of clinical syndrome imaging findings and failed conservative treatment I recommended ACDF at C5-6 and C6-7.  I have extensively gone over the risks and benefits of that operation with him as well as perioperative course expectations of outcome and alternatives of surgery and he understands and agrees to proceed forward.  Past Medical History:  Diagnosis Date   Angio-edema    Arthritis    GERD (gastroesophageal reflux disease)    History of kidney stones 2022    Past Surgical History:  Procedure Laterality Date   MENISCUS REPAIR Left 2011   TONSILLECTOMY      Family History  Problem Relation Age of Onset   Breast cancer Mother    Allergic rhinitis Father    Hypertension Father    Multiple myeloma Father    Asthma Neg Hx    Eczema Neg Hx    Urticaria Neg Hx    Social History:  reports that he has never smoked. He has never been exposed to tobacco smoke. He has never used smokeless tobacco. He reports current alcohol use of about 3.0 standard drinks of alcohol per week. He reports that he does not use drugs.  Allergies:  Allergies  Allergen Reactions   Bee Venom Hives, Itching, Swelling, Rash and Other (See Comments)    Swelling    Poison Oak Extract Hives and Rash   Pollen Extract Other (See Comments)    Medications Prior to Admission  Medication Sig Dispense Refill   chlorhexidine (PERIDEX) 0.12 % solution Use as directed 15 mLs in the mouth or throat daily as needed (oral irritation).     EPINEPHrine 0.3 mg/0.3 mL IJ SOAJ injection Inject 0.3 mg into the muscle as needed for anaphylaxis.     fexofenadine (ALLEGRA) 180 MG tablet Take 180 mg by mouth daily as needed for allergies.      ketoconazole (NIZORAL) 2 % shampoo Apply 1 application. topically daily as needed for irritation.     montelukast (SINGULAIR) 10 MG tablet Take 10 mg by mouth at bedtime.     Tadalafil 2.5 MG TABS Take 2.5 mg by mouth daily.     Azelastine-Fluticasone 137-50 MCG/ACT SUSP Place 1 spray into the nose in the morning and at bedtime. (Patient taking differently: Place 1 spray into the nose daily as needed (allergies).) 23 g 5    No results found for this or any previous visit (from the past 48 hour(s)). No results found.  Review of Systems  Musculoskeletal:  Positive for neck pain.  Neurological:  Positive for numbness.    Blood pressure 129/80, pulse (!) 53, temperature 97.7 F (36.5 C), temperature source Oral, resp. rate 18, height $RemoveBe'6\' 1"'IVeZYcBWu$  (1.854 m), weight 69.9 kg, SpO2 98 %. Physical Exam HENT:     Head: Normocephalic.     Right Ear: Tympanic membrane normal.     Nose: Nose normal.  Eyes:     Pupils: Pupils are equal, round, and reactive to light.  Cardiovascular:     Rate and Rhythm: Normal rate.  Pulmonary:     Effort: Pulmonary effort is normal.  Abdominal:     General: Abdomen is flat.  Musculoskeletal:        General: Normal range of motion.  Skin:  General: Skin is warm.  Neurological:     Mental Status: He is alert.     Comments: Strength 5-5 deltoid, bicep, tricep, wrist flexion, wrist extension, hand intrinsics.      Assessment/Plan 61 year old presents for an ACDF C5-6 C6-7  Elaina Hoops, MD 04/17/2022, 7:28 AM

## 2022-04-17 NOTE — Anesthesia Procedure Notes (Signed)
Procedure Name: Intubation Date/Time: 04/17/2022 7:46 AM  Performed by: Kyung Rudd, CRNAPre-anesthesia Checklist: Patient identified, Emergency Drugs available, Suction available and Patient being monitored Patient Re-evaluated:Patient Re-evaluated prior to induction Oxygen Delivery Method: Circle system utilized Preoxygenation: Pre-oxygenation with 100% oxygen Induction Type: IV induction Ventilation: Mask ventilation without difficulty Laryngoscope Size: Mac and 3 Grade View: Grade II Tube type: Oral Tube size: 7.5 mm Number of attempts: 1 Airway Equipment and Method: Stylet Placement Confirmation: ETT inserted through vocal cords under direct vision, positive ETCO2 and breath sounds checked- equal and bilateral Secured at: 21 cm Tube secured with: Tape Dental Injury: Teeth and Oropharynx as per pre-operative assessment

## 2022-04-17 NOTE — Op Note (Signed)
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  Postoperative diagnosis: Same.  Procedure: Anterior cervical discectomies and fusion at C5-6 and C6-7 utilizing the globus titanium cages packed with locally harvested autograft mixed with Vivigen.  Anterior cervical plating utilizing the globus resonate plating system  Surgeon: Jillyn Hidden Ever Halberg  Assistant: Julien Girt  Anesthesia: General  EBL: Minimal  HPI: 61 year old gentleman with neck pain left greater than right arm pain work-up revealed severe degenerative disc disease cervical spondylosis and stenosis with severe foraminal stenosis at C5-6 and C6-7.  Due to patient's progression of clinical syndrome imaging findings and failed conservative treatment I recommended an ACDF at C5-6 and C6-7.  I have extensively gone over the risks and benefits perioperative course expectations of outcome and alternatives of surgery and he understood and agreed to proceed forward.  Operative procedure: Patient was brought into the OR was Duson general anesthesia positioned behind the neck in slight extension 5 pounds halter traction.  The right side was next prepped and draped in routine sterile fashion preoperative x-ray localized the appropriate level so after this a curvilinear incision was made just off midline to the anterior border of the sternocleidomastoid.  The superficial of platysma was dissected out divided longitudinally.  The avascular plane between the sternomastoid and strap muscle was developed down to the prevertebral fascia and the prevertebral fascia was dissected away with Kitners.  Intraoperative x-ray confirmed identification of the appropriate level.  Annulotomy's were made with a 15 blade scalpel longus was reflected laterally and self-retaining retractors were placed.  Anterior osteophytes were bitten off with a 2 and 3 Miller Kerrison punch.  Both disc bases were drilled  down to the posterior osteophytic complexes and posterior logical ligament capturing the bone shavings and mucus trap.  First working at C5-6 this disc base was markedly collapsed aggressive under biting both endplates modification of the thecal sac and decompress the central canal marching laterally both C6 nerve roots were identified both C6 nerve roots were decompressed and skeletonized flush with the pedicle.  Extensive mount of uncinate hypertrophy and spondylosis and degenerative collapse was causing severe stenosis this was all relieved significantly decompressing both central and foraminal spaces.  Attention was then taken to C6-7 similar fashion C6-7 was drilled down there was a centralized spur and disc this was all removed decompressing central canal both C7 nerve roots were identified and decompressed flush with pedicle.  I then sized up a 6 mm implant at C5-6 and 8 mm at C6-7 and a 30 mm globus extend plate both titanium cages were packed with locally harvested autograft mixed with Vivigen with additional bone graft packed laterally and anteriorly cage the plate was then placed all screws had excellent purchase locking mechanisms were engaged and fluoroscopy confirmed good position of all the implants.  Wound was then copiously again meticulous hemostasis was maintained and the wound was closed in layers with opted Vicryl in the platysma and a running 4 subcuticular.  Dermabond benzoin Steri-Strips and a sterile dressing was applied patient recovery in stable condition.  At the end the case all needle counts and sponge counts were correct.

## 2022-04-18 ENCOUNTER — Encounter (HOSPITAL_COMMUNITY): Payer: Self-pay | Admitting: Neurosurgery

## 2022-04-19 NOTE — Anesthesia Postprocedure Evaluation (Signed)
Anesthesia Post Note  Patient: Joshua Sellers  Procedure(s) Performed: Anterior Cervical Decompression Fusion Cervical five-six Cervical six-seven     Patient location during evaluation: PACU Anesthesia Type: General Level of consciousness: awake and alert Pain management: pain level controlled Vital Signs Assessment: post-procedure vital signs reviewed and stable Respiratory status: spontaneous breathing, nonlabored ventilation, respiratory function stable and patient connected to nasal cannula oxygen Cardiovascular status: blood pressure returned to baseline and stable Postop Assessment: no apparent nausea or vomiting Anesthetic complications: no   No notable events documented.  Last Vitals:  Vitals:   04/17/22 1211 04/17/22 1239  BP: 135/79 134/77  Pulse: 68 67  Resp: 15 18  Temp: 36.7 C   SpO2: 98% 98%    Last Pain:  Vitals:   04/17/22 1211  TempSrc:   PainSc: 2                  Shyann Hefner

## 2022-04-20 ENCOUNTER — Ambulatory Visit (INDEPENDENT_AMBULATORY_CARE_PROVIDER_SITE_OTHER): Payer: BC Managed Care – PPO

## 2022-04-20 DIAGNOSIS — T63441D Toxic effect of venom of bees, accidental (unintentional), subsequent encounter: Secondary | ICD-10-CM | POA: Diagnosis not present

## 2022-04-27 ENCOUNTER — Ambulatory Visit (INDEPENDENT_AMBULATORY_CARE_PROVIDER_SITE_OTHER): Payer: BC Managed Care – PPO

## 2022-04-27 DIAGNOSIS — T63441D Toxic effect of venom of bees, accidental (unintentional), subsequent encounter: Secondary | ICD-10-CM

## 2022-05-04 ENCOUNTER — Ambulatory Visit: Payer: BC Managed Care – PPO

## 2022-05-04 ENCOUNTER — Ambulatory Visit (INDEPENDENT_AMBULATORY_CARE_PROVIDER_SITE_OTHER): Payer: BC Managed Care – PPO

## 2022-05-04 DIAGNOSIS — T63441D Toxic effect of venom of bees, accidental (unintentional), subsequent encounter: Secondary | ICD-10-CM | POA: Diagnosis not present

## 2022-05-10 ENCOUNTER — Ambulatory Visit (INDEPENDENT_AMBULATORY_CARE_PROVIDER_SITE_OTHER): Payer: BC Managed Care – PPO

## 2022-05-10 DIAGNOSIS — T63441D Toxic effect of venom of bees, accidental (unintentional), subsequent encounter: Secondary | ICD-10-CM

## 2022-05-17 ENCOUNTER — Ambulatory Visit (INDEPENDENT_AMBULATORY_CARE_PROVIDER_SITE_OTHER): Payer: BC Managed Care – PPO

## 2022-05-17 DIAGNOSIS — T63441D Toxic effect of venom of bees, accidental (unintentional), subsequent encounter: Secondary | ICD-10-CM | POA: Diagnosis not present

## 2022-05-24 ENCOUNTER — Ambulatory Visit: Payer: BC Managed Care – PPO

## 2022-05-31 ENCOUNTER — Ambulatory Visit (INDEPENDENT_AMBULATORY_CARE_PROVIDER_SITE_OTHER): Payer: BC Managed Care – PPO

## 2022-05-31 DIAGNOSIS — T63441D Toxic effect of venom of bees, accidental (unintentional), subsequent encounter: Secondary | ICD-10-CM | POA: Diagnosis not present

## 2022-06-21 ENCOUNTER — Ambulatory Visit (INDEPENDENT_AMBULATORY_CARE_PROVIDER_SITE_OTHER): Payer: BC Managed Care – PPO

## 2022-06-21 DIAGNOSIS — T63441D Toxic effect of venom of bees, accidental (unintentional), subsequent encounter: Secondary | ICD-10-CM | POA: Diagnosis not present

## 2022-07-19 ENCOUNTER — Ambulatory Visit (INDEPENDENT_AMBULATORY_CARE_PROVIDER_SITE_OTHER): Payer: BC Managed Care – PPO

## 2022-07-19 DIAGNOSIS — T63441D Toxic effect of venom of bees, accidental (unintentional), subsequent encounter: Secondary | ICD-10-CM | POA: Diagnosis not present

## 2022-08-16 ENCOUNTER — Ambulatory Visit (INDEPENDENT_AMBULATORY_CARE_PROVIDER_SITE_OTHER): Payer: BC Managed Care – PPO

## 2022-08-16 DIAGNOSIS — T63441D Toxic effect of venom of bees, accidental (unintentional), subsequent encounter: Secondary | ICD-10-CM | POA: Diagnosis not present

## 2022-09-13 ENCOUNTER — Ambulatory Visit (INDEPENDENT_AMBULATORY_CARE_PROVIDER_SITE_OTHER): Payer: BC Managed Care – PPO

## 2022-09-13 DIAGNOSIS — T782XXD Anaphylactic shock, unspecified, subsequent encounter: Secondary | ICD-10-CM | POA: Diagnosis not present

## 2022-09-13 DIAGNOSIS — T63481D Toxic effect of venom of other arthropod, accidental (unintentional), subsequent encounter: Secondary | ICD-10-CM | POA: Diagnosis not present

## 2022-10-11 ENCOUNTER — Ambulatory Visit (INDEPENDENT_AMBULATORY_CARE_PROVIDER_SITE_OTHER): Payer: BC Managed Care – PPO

## 2022-10-11 DIAGNOSIS — T63441D Toxic effect of venom of bees, accidental (unintentional), subsequent encounter: Secondary | ICD-10-CM

## 2022-11-06 ENCOUNTER — Ambulatory Visit (INDEPENDENT_AMBULATORY_CARE_PROVIDER_SITE_OTHER): Payer: BC Managed Care – PPO

## 2022-11-06 DIAGNOSIS — T63441D Toxic effect of venom of bees, accidental (unintentional), subsequent encounter: Secondary | ICD-10-CM

## 2022-12-06 ENCOUNTER — Ambulatory Visit (INDEPENDENT_AMBULATORY_CARE_PROVIDER_SITE_OTHER): Payer: BC Managed Care – PPO

## 2022-12-06 DIAGNOSIS — T63441D Toxic effect of venom of bees, accidental (unintentional), subsequent encounter: Secondary | ICD-10-CM

## 2023-01-03 ENCOUNTER — Ambulatory Visit (INDEPENDENT_AMBULATORY_CARE_PROVIDER_SITE_OTHER): Payer: BC Managed Care – PPO

## 2023-01-03 DIAGNOSIS — T63441D Toxic effect of venom of bees, accidental (unintentional), subsequent encounter: Secondary | ICD-10-CM

## 2023-01-30 ENCOUNTER — Ambulatory Visit (INDEPENDENT_AMBULATORY_CARE_PROVIDER_SITE_OTHER): Payer: BC Managed Care – PPO

## 2023-01-30 DIAGNOSIS — T63441D Toxic effect of venom of bees, accidental (unintentional), subsequent encounter: Secondary | ICD-10-CM | POA: Diagnosis not present

## 2023-02-28 ENCOUNTER — Ambulatory Visit (INDEPENDENT_AMBULATORY_CARE_PROVIDER_SITE_OTHER): Payer: BC Managed Care – PPO

## 2023-02-28 DIAGNOSIS — T63441D Toxic effect of venom of bees, accidental (unintentional), subsequent encounter: Secondary | ICD-10-CM | POA: Diagnosis not present

## 2023-03-28 ENCOUNTER — Ambulatory Visit: Payer: BC Managed Care – PPO

## 2023-04-02 ENCOUNTER — Ambulatory Visit (INDEPENDENT_AMBULATORY_CARE_PROVIDER_SITE_OTHER): Payer: BC Managed Care – PPO

## 2023-04-02 DIAGNOSIS — T63441D Toxic effect of venom of bees, accidental (unintentional), subsequent encounter: Secondary | ICD-10-CM

## 2023-04-25 ENCOUNTER — Ambulatory Visit: Payer: BC Managed Care – PPO

## 2023-05-02 ENCOUNTER — Ambulatory Visit (INDEPENDENT_AMBULATORY_CARE_PROVIDER_SITE_OTHER): Payer: BC Managed Care – PPO

## 2023-05-02 DIAGNOSIS — T63441D Toxic effect of venom of bees, accidental (unintentional), subsequent encounter: Secondary | ICD-10-CM

## 2023-05-30 ENCOUNTER — Ambulatory Visit: Payer: BC Managed Care – PPO | Admitting: Internal Medicine

## 2023-05-30 ENCOUNTER — Ambulatory Visit: Payer: BC Managed Care – PPO

## 2023-05-30 VITALS — BP 104/66 | HR 56 | Temp 97.7°F | Resp 18

## 2023-05-30 DIAGNOSIS — R058 Other specified cough: Secondary | ICD-10-CM | POA: Diagnosis not present

## 2023-05-30 DIAGNOSIS — J3089 Other allergic rhinitis: Secondary | ICD-10-CM

## 2023-05-30 DIAGNOSIS — R12 Heartburn: Secondary | ICD-10-CM

## 2023-05-30 DIAGNOSIS — T63441D Toxic effect of venom of bees, accidental (unintentional), subsequent encounter: Secondary | ICD-10-CM

## 2023-05-30 MED ORDER — AZELASTINE-FLUTICASONE 137-50 MCG/ACT NA SUSP: 1.0000 | Freq: Two times a day (BID) | NASAL | 5 refills | Status: AC

## 2023-05-30 NOTE — Progress Notes (Signed)
Follow Up Note  RE: Joshua Sellers MRN: [id number] DOB: 03/22/1961 Date of Office Visit: 05/30/2023  Referring provider: Malka So., MD Primary care provider: Malka So., MD  Chief Complaint: Follow-up (Doing well, annual venom shot visit. Pt wants to know if/when he can stop shots.)  History of Present Illness: I had the pleasure of seeing Joshua Sellers for a follow up visit at the [facility] on 05/30/2023. He is a 62 y.o. 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 His previous allergy office visit was on 01/18/22 with Dr. Selena Batten. Today is a regular follow up visit.  History obtained from patient, chart review.   Bee sting allergy Started VIT on 10/11/2021. Reached maintenance in 06/21/22.  Will switch to every 6 week now, currently on every 4 weeks. Getting VIT every 6 weeks  Still has Epipen.   Allergic rhinitis Taking Singulair and allegra daily without benefit. Will have daily nasal congestion. Not using nasal sprays. Did not feel like it is a benefit. Uses nasal spray on a rare basis.    Cough Mild.    Heartburn Not recently, well controlled.    Assessment and Plan: Joshua Sellers is a 62 y.o. male with: Toxic effect of venom of bees, accidental (unintentional), subsequent encounter  Other allergic rhinitis  Heartburn  Other cough   Plan: Patient Instructions  Anaphylaxis due to hymenoptera venom Past history - Episodes of breaking out in hives after being stung requiring systemic steroids but never epinephrine.  Interim history - 2022 bloodwork positive to white face hornet, yellow jacket, wasp and yellow hornet. Started VIT on 10/11/2021 (MV & Wasp). Continue to avoid. Move injections to every 6 weeks - In 05/2024 move to every 8 weeks, quit date is 05/2025  For mild symptoms you can take over the counter antihistamines such as Benadryl and monitor symptoms closely. If symptoms worsen or if  you have severe symptoms including breathing issues, throat closure, significant swelling, whole body hives, severe diarrhea and vomiting, lightheadedness then inject epinephrine and seek immediate medical care afterwards. Action plan in place.    Other allergic rhinitis Past history - Perennial rhinitis symptoms for 5 years. 2022 skin testing showed: Positive to one mold only. Interim history - controlled with Singulair and allegra. Continue environmental control measures as below. Use over the counter antihistamines such as Zyrtec (cetirizine), Claritin (loratadine), Allegra (fexofenadine), or Xyzal (levocetirizine) daily as needed. May switch antihistamines every few months. Continue Singulair (montelukast) 10mg  daily at night. May use dymista (fluticasone + azelastine nasal spray combination) 1 spray per nostril twice a day if needed. Sample on rylatris given, this is the equivalent of dymista which has been prescribed and covered by insurance.  Nasal saline spray (i.e., Simply Saline) or nasal saline lavage (i.e., NeilMed) is recommended as needed and prior to medicated nasal sprays.   Heartburn Continue lifestyle and dietary modifications.  Follow up: 12 months   Thank you so much for letting me partake in your care today.  Don't hesitate to reach out if you have any additional concerns!  Ferol Luz, MD  [facility] [place] [place]    Meds ordered this encounter  Medications   Azelastine-Fluticasone (DYMISTA) 137-50 MCG/ACT SUSP    Sig: Place 1 spray into the nose in the morning and at bedtime.    Dispense:  23 g    Refill:  5    Lab Orders  No laboratory test(s) ordered today   Diagnostics: None done    Medication List:  Current Outpatient Medications  Medication Sig Dispense Refill   Azelastine-Fluticasone (DYMISTA) 137-50 MCG/ACT SUSP Place 1 spray into the nose in the morning and at bedtime. 23 g 5   chlorhexidine (PERIDEX) 0.12 % solution Use  as directed 15 mLs in the mouth or throat daily as needed (oral irritation).     cyclobenzaprine (FLEXERIL) 10 MG tablet Take 1 tablet (10 mg total) by mouth 3 (three) times daily as needed for muscle spasms. 30 tablet 0   EPINEPHrine 0.3 mg/0.3 mL IJ SOAJ injection Inject 0.3 mg into the muscle as needed for anaphylaxis.     fexofenadine (ALLEGRA) 180 MG tablet Take 180 mg by mouth daily as needed for allergies.     HYDROcodone-acetaminophen (NORCO/VICODIN) 5-325 MG tablet Take 2 tablets by mouth every 4 (four) hours as needed for severe pain ((score 7 to 10)). 30 tablet 0   ketoconazole (NIZORAL) 2 % shampoo Apply 1 application. topically daily as needed for irritation.     montelukast (SINGULAIR) 10 MG tablet Take 10 mg by mouth at bedtime.     Tadalafil 2.5 MG TABS Take 2.5 mg by mouth daily.     No current facility-administered medications for this visit.   Allergies: Allergies  Allergen Reactions   Bee Venom Hives, Itching, Swelling, Rash and Other (See Comments)    Swelling    Poison Oak Extract Hives and Rash   Pollen Extract Other (See Comments)   I reviewed his past medical history, social history, family history, and environmental history and no significant changes have been reported from his previous visit.  ROS: All others negative except as noted per HPI.   Objective: BP 104/66   Pulse (!) 56   Temp 97.7 F (36.5 C) (Temporal)   Resp 18   SpO2 97%  There is no height or weight on file to calculate BMI. General Appearance:  Alert, cooperative, no distress, appears stated age  Head:  Normocephalic, without obvious abnormality, atraumatic  Eyes:  Conjunctiva clear, EOM's intact  Nose: Nares normal,  erythematous nasal mucosa, no visible anterior polyps, and septum midline  Throat: Lips, tongue normal; teeth and gums normal, normal posterior oropharynx  Neck: Supple, symmetrical  Lungs:   clear to auscultation bilaterally, Respirations unlabored, no coughing  Heart:   regular rate and rhythm and no murmur, Appears well perfused  Extremities: No edema  Skin: Skin color, texture, turgor normal, no rashes or lesions on visualized portions of skin  Neurologic: No gross deficits   Previous notes and tests were reviewed. The plan was reviewed with the patient/family, and all questions/concerned were addressed.  It was my pleasure to see Joshua Sellers today and participate in his care. Please feel free to contact me with any questions or concerns.  Sincerely,  Ferol Luz, MD  Allergy & Immunology  [facility] Office: [phone]

## 2023-05-30 NOTE — Patient Instructions (Addendum)
Anaphylaxis due to hymenoptera venom Past history - Episodes of breaking out in hives after being stung requiring systemic steroids but never epinephrine.  Interim history - 2022 bloodwork positive to white face hornet, yellow jacket, wasp and yellow hornet. Started VIT on 10/11/2021 (MV & Wasp). Continue to avoid. Move injections to every 6 weeks - In 05/2024 move to every 8 weeks, quit date is 05/2025  For mild symptoms you can take over the counter antihistamines such as Benadryl and monitor symptoms closely. If symptoms worsen or if you have severe symptoms including breathing issues, throat closure, significant swelling, whole body hives, severe diarrhea and vomiting, lightheadedness then inject epinephrine and seek immediate medical care afterwards. Action plan in place.    Other allergic rhinitis Past history - Perennial rhinitis symptoms for 5 years. 2022 skin testing showed: Positive to one mold only. Interim history - controlled with Singulair and allegra. Continue environmental control measures as below. Use over the counter antihistamines such as Zyrtec (cetirizine), Claritin (loratadine), Allegra (fexofenadine), or Xyzal (levocetirizine) daily as needed. May switch antihistamines every few months. Continue Singulair (montelukast) 10mg  daily at night. May use dymista (fluticasone + azelastine nasal spray combination) 1 spray per nostril twice a day if needed. Sample on rylatris given, this is the equivalent of dymista which has been prescribed and covered by insurance.  Nasal saline spray (i.e., Simply Saline) or nasal saline lavage (i.e., NeilMed) is recommended as needed and prior to medicated nasal sprays.   Heartburn Continue lifestyle and dietary modifications.  Follow up: 12 months   Thank you so much for letting me partake in your care today.  Don't hesitate to reach out if you have any additional concerns!  Ferol Luz, MD  [facility] [place] [place]

## 2023-06-07 IMAGING — RF DG CERVICAL SPINE 1V
1 series · 1 of 1 positions shown · non-contrast
Comparison: None Available.

FLUOROSCOPY:
Air kerma 0.40 mGy

CLINICAL DATA: ACDF C5 through C7

EXAM:
DG CERVICAL SPINE - 1 VIEW; DG C-ARM 1-60 MIN-NO REPORT

[Series 1: run · 1 of 1 slices shown]
[im 1/1]
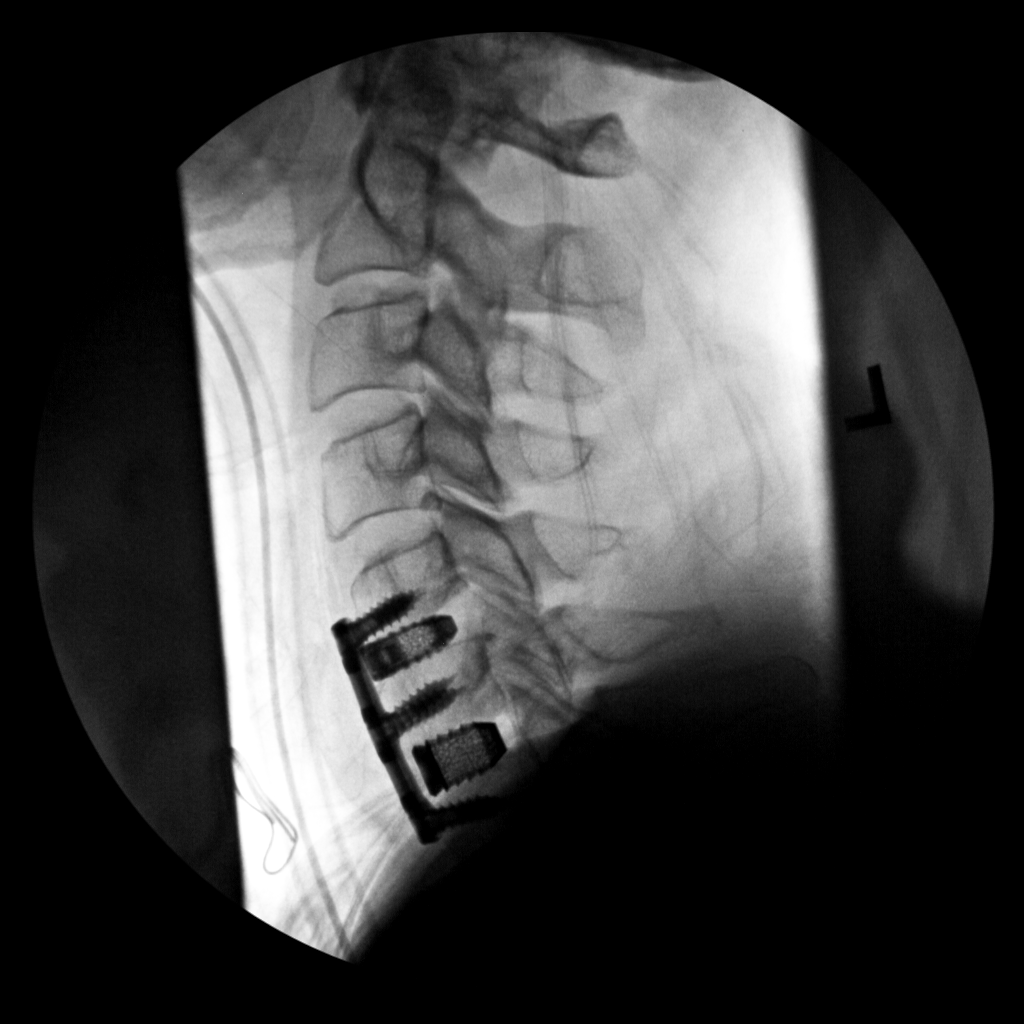

[1 of 1 positions shown; findings below may reference images not displayed]

FINDINGS: Intraoperative lateral fluoroscopic image of the cervical spine
demonstrates anterior cervical discectomy and fusion of C5 through
C7. No obvious perihardware fracture.
IMPRESSION: Intraoperative lateral fluoroscopic image of the cervical spine
demonstrates anterior cervical discectomy and fusion of C5 through
C7. No obvious perihardware fracture.

## 2023-06-27 ENCOUNTER — Ambulatory Visit: Payer: BC Managed Care – PPO

## 2023-07-11 ENCOUNTER — Ambulatory Visit (INDEPENDENT_AMBULATORY_CARE_PROVIDER_SITE_OTHER): Payer: BC Managed Care – PPO

## 2023-07-11 DIAGNOSIS — T63441D Toxic effect of venom of bees, accidental (unintentional), subsequent encounter: Secondary | ICD-10-CM | POA: Diagnosis not present

## 2023-08-22 ENCOUNTER — Ambulatory Visit (INDEPENDENT_AMBULATORY_CARE_PROVIDER_SITE_OTHER): Payer: BC Managed Care – PPO

## 2023-08-22 DIAGNOSIS — T63441D Toxic effect of venom of bees, accidental (unintentional), subsequent encounter: Secondary | ICD-10-CM

## 2023-10-03 ENCOUNTER — Ambulatory Visit (INDEPENDENT_AMBULATORY_CARE_PROVIDER_SITE_OTHER): Payer: BC Managed Care – PPO

## 2023-10-03 DIAGNOSIS — T63441D Toxic effect of venom of bees, accidental (unintentional), subsequent encounter: Secondary | ICD-10-CM

## 2023-11-14 ENCOUNTER — Ambulatory Visit (INDEPENDENT_AMBULATORY_CARE_PROVIDER_SITE_OTHER): Payer: BC Managed Care – PPO

## 2023-11-14 DIAGNOSIS — T63441D Toxic effect of venom of bees, accidental (unintentional), subsequent encounter: Secondary | ICD-10-CM

## 2023-12-25 ENCOUNTER — Other Ambulatory Visit: Payer: Self-pay | Admitting: Medical Genetics

## 2023-12-26 ENCOUNTER — Ambulatory Visit (INDEPENDENT_AMBULATORY_CARE_PROVIDER_SITE_OTHER): Payer: BC Managed Care – PPO

## 2023-12-26 DIAGNOSIS — T63441D Toxic effect of venom of bees, accidental (unintentional), subsequent encounter: Secondary | ICD-10-CM

## 2024-01-08 ENCOUNTER — Other Ambulatory Visit: Payer: BC Managed Care – PPO

## 2024-01-08 DIAGNOSIS — Z006 Encounter for examination for normal comparison and control in clinical research program: Secondary | ICD-10-CM

## 2024-01-16 LAB — GENECONNECT MOLECULAR SCREEN: Genetic Analysis Overall Interpretation: NEGATIVE

## 2024-01-23 ENCOUNTER — Ambulatory Visit: Payer: BC Managed Care – PPO

## 2024-02-06 ENCOUNTER — Ambulatory Visit (INDEPENDENT_AMBULATORY_CARE_PROVIDER_SITE_OTHER)

## 2024-02-06 DIAGNOSIS — T63441D Toxic effect of venom of bees, accidental (unintentional), subsequent encounter: Secondary | ICD-10-CM | POA: Diagnosis not present

## 2024-03-10 ENCOUNTER — Ambulatory Visit: Admitting: Family

## 2024-03-17 ENCOUNTER — Ambulatory Visit

## 2024-03-17 ENCOUNTER — Other Ambulatory Visit: Payer: Self-pay

## 2024-03-17 DIAGNOSIS — T63441D Toxic effect of venom of bees, accidental (unintentional), subsequent encounter: Secondary | ICD-10-CM

## 2024-04-30 ENCOUNTER — Ambulatory Visit (INDEPENDENT_AMBULATORY_CARE_PROVIDER_SITE_OTHER)

## 2024-04-30 DIAGNOSIS — T63441D Toxic effect of venom of bees, accidental (unintentional), subsequent encounter: Secondary | ICD-10-CM | POA: Diagnosis not present

## 2024-06-11 ENCOUNTER — Ambulatory Visit

## 2024-06-11 DIAGNOSIS — T63441D Toxic effect of venom of bees, accidental (unintentional), subsequent encounter: Secondary | ICD-10-CM | POA: Diagnosis not present

## 2024-08-06 ENCOUNTER — Ambulatory Visit: Payer: Self-pay

## 2024-08-06 DIAGNOSIS — T63441D Toxic effect of venom of bees, accidental (unintentional), subsequent encounter: Secondary | ICD-10-CM

## 2024-10-01 ENCOUNTER — Ambulatory Visit: Payer: Self-pay

## 2024-10-01 DIAGNOSIS — T63441D Toxic effect of venom of bees, accidental (unintentional), subsequent encounter: Secondary | ICD-10-CM

## 2024-11-26 ENCOUNTER — Ambulatory Visit

## 2024-11-26 DIAGNOSIS — T63441D Toxic effect of venom of bees, accidental (unintentional), subsequent encounter: Secondary | ICD-10-CM

## 2025-01-14 ENCOUNTER — Ambulatory Visit

## 2025-03-10 ENCOUNTER — Ambulatory Visit: Admitting: Internal Medicine

## 2025-03-10 ENCOUNTER — Ambulatory Visit
